# Patient Record
Sex: Male | Born: 1990
Health system: Southern US, Community
[De-identification: ages and names within clinical notes are randomized; demographics above are authoritative.]

## PROBLEM LIST (undated history)

## (undated) DIAGNOSIS — F419 Anxiety disorder, unspecified: Secondary | ICD-10-CM

## (undated) HISTORY — PX: NO PAST SURGERIES: SHX2092

## (undated) HISTORY — DX: Anxiety disorder, unspecified: F41.9

---
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
## 2010-02-15 ENCOUNTER — Emergency Department: Payer: Self-pay | Admitting: Emergency Medicine

## 2011-05-10 ENCOUNTER — Ambulatory Visit: Payer: Self-pay | Admitting: Pediatrics

## 2011-12-22 ENCOUNTER — Ambulatory Visit: Payer: Self-pay | Admitting: Pediatrics

## 2013-11-24 ENCOUNTER — Emergency Department: Payer: Self-pay | Admitting: Emergency Medicine

## 2013-11-24 LAB — URINALYSIS, COMPLETE
BILIRUBIN, UR: NEGATIVE
Bacteria: NONE SEEN
Blood: NEGATIVE
GLUCOSE, UR: NEGATIVE mg/dL (ref 0–75)
KETONE: NEGATIVE
LEUKOCYTE ESTERASE: NEGATIVE
Nitrite: NEGATIVE
Ph: 8 (ref 4.5–8.0)
Protein: NEGATIVE
RBC, UR: NONE SEEN /HPF (ref 0–5)
SPECIFIC GRAVITY: 1.011 (ref 1.003–1.030)
Squamous Epithelial: NONE SEEN
WBC UR: 1 /HPF (ref 0–5)

## 2013-11-24 LAB — CBC
HCT: 44.2 % (ref 40.0–52.0)
HGB: 14.9 g/dL (ref 13.0–18.0)
MCH: 30.6 pg (ref 26.0–34.0)
MCHC: 33.7 g/dL (ref 32.0–36.0)
MCV: 91 fL (ref 80–100)
Platelet: 202 10*3/uL (ref 150–440)
RBC: 4.86 10*6/uL (ref 4.40–5.90)
RDW: 12.6 % (ref 11.5–14.5)
WBC: 5.5 10*3/uL (ref 3.8–10.6)

## 2013-11-24 LAB — LIPASE, BLOOD: Lipase: 141 U/L (ref 73–393)

## 2013-11-24 LAB — COMPREHENSIVE METABOLIC PANEL
ALBUMIN: 4.5 g/dL (ref 3.4–5.0)
ALT: 14 U/L (ref 12–78)
Alkaline Phosphatase: 72 U/L
Anion Gap: 3 — ABNORMAL LOW (ref 7–16)
BUN: 11 mg/dL (ref 7–18)
Bilirubin,Total: 0.5 mg/dL (ref 0.2–1.0)
CALCIUM: 8.8 mg/dL (ref 8.5–10.1)
CHLORIDE: 107 mmol/L (ref 98–107)
CREATININE: 0.96 mg/dL (ref 0.60–1.30)
Co2: 29 mmol/L (ref 21–32)
EGFR (Non-African Amer.): >60
GLUCOSE: 83 mg/dL (ref 65–99)
OSMOLALITY: 276 (ref 275–301)
POTASSIUM: 4.4 mmol/L (ref 3.5–5.1)
SGOT(AST): 24 U/L (ref 15–37)
Sodium: 139 mmol/L (ref 136–145)
Total Protein: 7.4 g/dL (ref 6.4–8.2)

## 2014-10-30 DIAGNOSIS — Y998 Other external cause status: Secondary | ICD-10-CM | POA: Insufficient documentation

## 2014-10-30 DIAGNOSIS — Z720 Tobacco use: Secondary | ICD-10-CM | POA: Insufficient documentation

## 2014-10-30 DIAGNOSIS — Z23 Encounter for immunization: Secondary | ICD-10-CM | POA: Insufficient documentation

## 2014-10-30 DIAGNOSIS — Y92410 Unspecified street and highway as the place of occurrence of the external cause: Secondary | ICD-10-CM | POA: Diagnosis not present

## 2014-10-30 DIAGNOSIS — S81812A Laceration without foreign body, left lower leg, initial encounter: Secondary | ICD-10-CM | POA: Insufficient documentation

## 2014-10-30 DIAGNOSIS — Y9389 Activity, other specified: Secondary | ICD-10-CM | POA: Insufficient documentation

## 2014-10-30 DIAGNOSIS — S8992XA Unspecified injury of left lower leg, initial encounter: Secondary | ICD-10-CM | POA: Diagnosis present
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
## 2014-10-31 ENCOUNTER — Emergency Department
Admission: EM | Admit: 2014-10-31 | Discharge: 2014-10-31 | Disposition: A | Discharge status: Home / Self Care | Payer: BLUE CROSS/BLUE SHIELD | Attending: Emergency Medicine | Admitting: Emergency Medicine

## 2014-10-31 ENCOUNTER — Encounter: Payer: Self-pay | Admitting: Emergency Medicine

## 2014-10-31 ENCOUNTER — Emergency Department: Payer: BLUE CROSS/BLUE SHIELD

## 2014-10-31 DIAGNOSIS — S81812A Laceration without foreign body, left lower leg, initial encounter: Secondary | ICD-10-CM

## 2014-10-31 MED ORDER — HYDROCODONE-ACETAMINOPHEN 5-325 MG PO TABS
ORAL_TABLET | ORAL | Status: AC
  Administered 2014-10-31: 1 via ORAL
  Filled 2014-10-31: qty 1

## 2014-10-31 MED ORDER — TETANUS-DIPHTHERIA TOXOIDS TD 5-2 LFU IM INJ: 0.5000 mL | INJECTION | Freq: Once | INTRAMUSCULAR | Status: AC

## 2014-10-31 MED ORDER — TETANUS-DIPHTH-ACELL PERTUSSIS 5-2.5-18.5 LF-MCG/0.5 IM SUSP
INTRAMUSCULAR | Status: AC
  Administered 2014-10-31: 0.5 mL via INTRAMUSCULAR
  Filled 2014-10-31: qty 0.5

## 2014-10-31 MED ORDER — LIDOCAINE-EPINEPHRINE 2 %-1:100000 IJ SOLN
20.0000 mL | Freq: Once | INTRAMUSCULAR | Status: AC
  Administered 2014-10-31: 20 mL
  Filled 2014-10-31: qty 20

## 2014-10-31 MED ORDER — LIDOCAINE-EPINEPHRINE (PF) 1 %-1:200000 IJ SOLN
INTRAMUSCULAR | Status: AC
  Filled 2014-10-31: qty 30

## 2014-10-31 MED ORDER — HYDROCODONE-ACETAMINOPHEN 5-325 MG PO TABS
1.0000 | ORAL_TABLET | Freq: Once | ORAL | Status: AC
  Administered 2014-10-31: 1 via ORAL
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
## 2014-10-31 NOTE — ED Notes (Signed)
MD at bedside. 

## 2014-10-31 NOTE — Discharge Instructions (Signed)

## 2015-06-10 ENCOUNTER — Encounter: Payer: Self-pay | Admitting: Family Medicine

## 2015-06-10 ENCOUNTER — Ambulatory Visit (INDEPENDENT_AMBULATORY_CARE_PROVIDER_SITE_OTHER): Payer: BLUE CROSS/BLUE SHIELD | Admitting: Family Medicine

## 2015-06-10 VITALS — BP 112/62 | HR 68 | Temp 98.4°F | Resp 16 | Ht 73.25 in | Wt 149.0 lb

## 2015-06-10 DIAGNOSIS — F329 Major depressive disorder, single episode, unspecified: Secondary | ICD-10-CM

## 2015-06-10 DIAGNOSIS — J302 Other seasonal allergic rhinitis: Secondary | ICD-10-CM | POA: Diagnosis not present

## 2015-06-10 DIAGNOSIS — F32A Depression, unspecified: Secondary | ICD-10-CM | POA: Insufficient documentation

## 2015-06-10 MED ORDER — DIVALPROEX SODIUM 125 MG PO DR TAB: 125.0000 mg | DELAYED_RELEASE_TABLET | Freq: Two times a day (BID) | ORAL | Status: DC

## 2015-06-10 NOTE — Progress Notes (Deleted)
Patient ID: Wesley Sharp Gotsch, male   DOB: 30-Mar-1991, 24 y.o.   MRN: [id number]         Patient: Wesley Sharp Anastos Male    DOB: 30-Mar-1991   24 y.o.   MRN: [id number] Visit Date: 06/10/2015  Today's Provider: Lorie PhenixNancy Maloney, MD   Chief Complaint  Patient presents with  . Establish Care   Subjective:    Depression        This is a chronic problem.  The current episode started more than 1 year ago.   The onset quality is gradual.   The problem has been gradually worsening since onset.  Associated symptoms include decreased concentration, fatigue, insomnia, restlessness, appetite change (Not much of an appetite) and sad (Pt says this isn't too bad. ).  Associated symptoms include no helplessness, no hopelessness, not irritable, no decreased interest, no body aches, no myalgias, no headaches, no indigestion and no suicidal ideas.  Past medical history includes anxiety.   Anxiety Presents for initial visit. Onset was 1 to 5 years ago. The problem has been gradually worsening. Symptoms include decreased concentration, excessive worry, insomnia, muscle tension, nervous/anxious behavior, panic (Pt has a panic attack 1-2 times a week. ) and restlessness. Patient reports no chest pain, compulsions, confusion, dizziness, feeling of choking, irritability, obsessions, palpitations or suicidal ideas. The quality of sleep is poor.         Allergies  Allergen Reactions  . Percocet [Oxycodone-Acetaminophen] Other (See Comments)  . Sulfa Antibiotics Rash   Previous Medications   No medications on file    Review of Systems  Constitutional: Positive for activity change, appetite change (Not much of an appetite) and fatigue. Negative for irritability.  Respiratory: Negative.   Cardiovascular: Negative.  Negative for chest pain and palpitations.  Gastrointestinal: Negative.   Musculoskeletal: Negative for myalgias.  Neurological: Negative for dizziness, tremors, seizures, syncope, weakness,  light-headedness, numbness and headaches.  Psychiatric/Behavioral: Positive for depression, sleep disturbance, dysphoric mood and decreased concentration. Negative for suicidal ideas, hallucinations, behavioral problems, confusion, self-injury and agitation. The patient is nervous/anxious, has insomnia and is hyperactive.     Social History  Substance Use Topics  . Smoking status: Current Every Day Smoker -- 0.25 packs/day for 4 years    Types: Cigarettes  . Smokeless tobacco: Never Used  . Alcohol Use: Yes     Comment: Very Rarely   Objective:   BP 112/62 mmHg  Pulse 68  Temp(Src) 98.4 F (36.9 C) (Oral)  Resp 16  Ht 6' 1.25" (1.861 m)  Wt 149 lb (67.586 kg)  BMI 19.51 kg/m2  Physical Exam  Constitutional: He is not irritable.        Assessment & Plan:           Lorie PhenixNancy Maloney, MD  [facility]

## 2015-06-10 NOTE — Progress Notes (Signed)
Subjective:     Patient ID: Wesley Sharp, male   DOB: 07/26/1990, 24 y.o.   MRN: [id number]  Chief Complaint  Patient presents with  . Establish Care    Depression        This is a chronic problem.  The current episode started more than 1 year ago.   The onset quality is gradual.   The problem occurs constantly (He has good days and bad days.  ).  The problem has been gradually worsening since onset.  Associated symptoms include decreased concentration, irritable and decreased interest.  Associated symptoms include no helplessness, no hopelessness and no suicidal ideas.     The symptoms are aggravated by work stress (Has potential to get a new job.  ).  Past treatments include SSRIs - Selective serotonin reuptake inhibitors (Zoloft a few years ago.  ).  Previous treatment provided no relief (made him sick and also did not like sexual side effects.  ) relief.  Risk factors include family history of mental illness and illicit drug use.   He saw a counselor who advocates for diet, exercise, nonpharmacologic methods, which is not helping. Would like referral to psychiatrist.     No suicidal or homicidal ideation, or history or self-harm. No auditory or visual hallucination. Does have some manic symptoms.    He self-medicates with marijuana every other day. Rarely drinks alcohol. Smokers 1 pack every 2 weeks. Pt lives by himself (2 yrs).  Does well with this.   Is willing to see a psychiatrist.      Anxiety Does have panic attacks 1-2 times per week.   Review of Systems  Constitutional: Positive for activity change.  HENT: Negative.   Eyes: Negative.   Respiratory: Negative.   Cardiovascular: Negative.   Gastrointestinal: Negative.   Endocrine: Negative.   Genitourinary: Negative.   Musculoskeletal: Negative.   Skin: Negative.   Allergic/Immunologic: Negative.   Neurological: Negative.   Hematological: Negative.   Psychiatric/Behavioral: Positive for depression and decreased  concentration. Negative for suicidal ideas.   Previous Medications   No medications on file   Allergies  Allergen Reactions  . Percocet [Oxycodone-Acetaminophen] Other (See Comments)  . Sulfa Antibiotics Rash   Past Surgical History  Procedure Laterality Date  . No past surgeries     Family History  Problem Relation Age of Onset  . Healthy Mother   . Healthy Father   . Asthma Brother   . Healthy Brother   . Diabetes Maternal Uncle   . Bipolar disorder Paternal Aunt   . Mental illness Maternal Uncle    Social History   Social History  . Marital Status: Single    Spouse Name: N/A  . Number of Children: N/A  . Years of Education: H/S   Occupational History  . Full-Time     [employer]   Social History Main Topics  . Smoking status: Current Every Day Smoker -- 0.25 packs/day for 4 years    Types: Cigarettes  . Smokeless tobacco: Never Used  . Alcohol Use: Yes     Comment: Very Rarely  . Drug Use: Yes    Special: Marijuana     Comment: 1 Every Two days.  . Sexual Activity: Not on file   Other Topics Concern  . Not on file   Social History Narrative  Mom in EMS and accompanies pt to appointment. Mom is supportive, understanding and wants to help her son get better. Pt is looking for a new job,  and wants to get depression under control.      Objective:   Physical Exam  Constitutional: He is oriented to person, place, and time. He appears well-developed and well-nourished. He is irritable. No distress.  HENT:  Head: Normocephalic and atraumatic.  Eyes: Conjunctivae and EOM are normal. Pupils are equal, round, and reactive to light.  Pulmonary/Chest: No respiratory distress.  Abdominal: He exhibits no distension.  Musculoskeletal: Normal range of motion.  Neurological: He is alert and oriented to person, place, and time. No cranial nerve deficit. Coordination normal.  Skin: Skin is dry.  Psychiatric: He has a normal mood and affect. His behavior is normal.  Judgment and thought content normal.  Pleasant. Responds appropriately. Smiles at times.     BP 112/62 mmHg  Pulse 68  Temp(Src) 98.4 F (36.9 C) (Oral)  Resp 16  Ht 6' 1.25" (1.861 m)  Wt 149 lb (67.586 kg)  BMI 19.51 kg/m2      Assessment:     24 yo male with significant anxiety and depression, concerning for bipolar disorder.      Plan:     1. Seasonal allergic rhinitis Stable.   2. Depression New problem. Will treat with Depakote secondary to poor response to Zoloft, and possible bipolar. Will refer to psychiatry for further treatment.  Contracted for safety.  Will call if worsens or does not improve before follow up .   - divalproex (DEPAKOTE) 125 MG DR tablet; Take 1 tablet (125 mg total) by mouth 2 (two) times daily.  Dispense: 60 tablet; Refill: 5 - Ambulatory referral to Psychiatry     Lorie PhenixNancy Kayleann Mccaffery, MD

## 2015-07-24 ENCOUNTER — Encounter: Payer: Self-pay | Admitting: Psychiatry

## 2015-07-24 ENCOUNTER — Ambulatory Visit (INDEPENDENT_AMBULATORY_CARE_PROVIDER_SITE_OTHER): Payer: BLUE CROSS/BLUE SHIELD | Admitting: Psychiatry

## 2015-07-24 VITALS — BP 108/62 | HR 66 | Temp 98.1°F | Ht 73.25 in | Wt 144.6 lb

## 2015-07-24 DIAGNOSIS — F4001 Agoraphobia with panic disorder: Secondary | ICD-10-CM | POA: Diagnosis not present

## 2015-07-24 MED ORDER — MIRTAZAPINE 15 MG PO TABS: 15.0000 mg | ORAL_TABLET | Freq: Every day | ORAL | Status: DC

## 2015-07-24 MED ORDER — HYDROXYZINE PAMOATE 25 MG PO CAPS: 25.0000 mg | ORAL_CAPSULE | Freq: Two times a day (BID) | ORAL | Status: DC | PRN

## 2015-07-24 NOTE — Progress Notes (Signed)
Psychiatric Initial Adult Assessment   Patient Identification: Wesley Sharp MRN:  [id number] Date of Evaluation:  07/24/2015 Referral Source: [facility] Chief Complaint:   Chief Complaint    Establish Care; Anxiety; Panic Attack; Depression; Stress     Visit Diagnosis:    ICD-9-CM ICD-10-CM   1. Panic disorder with agoraphobia and mild panic attacks 300.21 F40.01    Diagnosis:   Patient Active Problem List   Diagnosis Date Noted  . Seasonal allergic rhinitis [J30.2] 06/10/2015  . Depression [F32.9] 06/10/2015   History of Present Illness:   Pt is a 25 year old male who was referred by his primary care physician at [facility] family [facility]. He presented for initial assessment. Patient reported that he has long history of anxiety and depression and was recently started on Depakote. Patient reported that he has tried Zoloft in the past but it did not help. He reported that he has started having worsening of his symptoms since he stopped using the marijuana 2 weeks ago. He reported that he has applied for a job at [employer] and they drug tested him and he cannot continue using marijuana. He reported that he feels very anxious as he continues to have withdrawal symptoms from the marijuana. He reported that he has panic attacks at times. He has also lost his appetite and is not able to eat well. He does not eat head home but when he is outside the home he is able to eat some. Patient reported that he is not sleeping well at night. Patient reported that he does not have any suicidal ideations or plans. He reported that he was restarted on Depakote by his primary care physician as they has diagnosed him with bipolar but he has not noticed any improvement in his symptoms. He appeared calm and collective during the interview. He reported that he wants to gain weight as he has never been able to gain any weight in his life. He is very close to his family who all live very close to him.  He denied any pending legal charges. He denied having any suicidal homicidal ideations or plans.  Elements:  Severity:  moderate. Associated Signs/Symptoms: Depression Symptoms:  depressed mood, fatigue, difficulty concentrating, hopelessness, anxiety, panic attacks, loss of energy/fatigue, disturbed sleep, decreased appetite, (Hypo) Manic Symptoms:  Irritable Mood, Labiality of Mood, Anxiety Symptoms:  Excessive Worry, Panic Symptoms, Psychotic Symptoms:  none PTSD Symptoms: Negative NA  Past Medical History:  Past Medical History  Diagnosis Date  . Anxiety     Past Surgical History  Procedure Laterality Date  . No past surgeries     Family History:  Family History  Problem Relation Age of Onset  . Healthy Mother   . Healthy Father   . Asthma Brother   . Healthy Brother   . Depression Brother   . Diabetes Maternal Uncle   . Bipolar disorder Paternal Aunt   . Mental illness Maternal Uncle    Social History:   Social History   Social History  . Marital Status: Single    Spouse Name: N/A  . Number of Children: N/A  . Years of Education: H/S   Occupational History  . Full-Time     [employer]   Social History Main Topics  . Smoking status: Former Smoker -- 0.25 packs/day for 4 years    Types: Cigarettes    Quit date: 07/10/2015  . Smokeless tobacco: Never Used  . Alcohol Use: No     Comment: Very  Rarely  . Drug Use: Yes    Special: Marijuana     Comment: last used 2 weeks ago  . Sexual Activity: Yes    Birth Control/ Protection: Condom   Other Topics Concern  . None   Social History Narrative   Additional Social History:  Lives by himself. Has a Girlfriend she lives in [place], known her since 5 months.  Never married, no children.  No charges.  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 Family lives closeby.     Musculoskeletal: Strength & Muscle Tone: within normal limits Gait & Station:  normal Patient leans: Right  Psychiatric Specialty Exam: HPI  ROS  Blood pressure 108/62, pulse 66, temperature 98.1 F (36.7 C), temperature source Tympanic, height 6' 1.25" (1.861 m), weight 144 lb 9.6 oz (65.59 kg), SpO2 97 %.Body mass index is 18.94 kg/(m^2).  General Appearance: Casual and Fairly Groomed  Eye Contact:  Fair  Speech:  Clear and Coherent and Normal Rate  Volume:  Normal  Mood:  Anxious  Affect:  Congruent  Thought Process:  Coherent and Goal Directed  Orientation:  Full (Time, Place, and Person)  Thought Content:  WDL  Suicidal Thoughts:  No  Homicidal Thoughts:  No  Memory:  Immediate;   Fair  Judgement:  Fair  Insight:  Fair  Psychomotor Activity:  Normal  Concentration:  Fair  Recall:  [employer] of Knowledge:Fair  Language: Fair  Akathisia:  No  Handed:  Right  AIMS (if indicated):    Assets:  Communication Skills Desire for Improvement Physical Health Social Support  ADL's:  Intact  Cognition: WNL  Sleep:  fair   Is the patient at risk to self?  No. Has the patient been a risk to self in the past 6 months?  No. Has the patient been a risk to self within the distant past?  No. Is the patient a risk to others?  No. Has the patient been a risk to others in the past 6 months?  No. Has the patient been a risk to others within the distant past?  No.  Allergies:   Allergies  Allergen Reactions  . Percocet [Oxycodone-Acetaminophen] Other (See Comments)  . Sulfa Antibiotics Rash   Current Medications: Current Outpatient Prescriptions  Medication Sig Dispense Refill  . divalproex (DEPAKOTE) 125 MG DR tablet Take 1 tablet (125 mg total) by mouth 2 (two) times daily. 60 tablet 5   No current facility-administered medications for this visit.    Previous Psychotropic Medications:  Zoloft- sick on stomach Friend has given Klonopin in past Grandmother has given him Xanax 1 time in the past . Depakote started 2 months ago at a very low dose by  the PCP   Substance Abuse History in the last 12 months:  Yes.    MJ- Stopped 2 weeks ago.  No Alcohol   Consequences of Substance Abuse: Negative NA  Medical Decision Making:  Review of Psycho-Social Stressors (1)  Treatment Plan Summary: Medication management  Discussed with patient at length about the medications treatment risks benefits and alternatives He agreed with the plan to start on mirtazapine 15 mg at bedtime to help with insomnia and weight gain. I will also give him a prescription of hydroxyzine 25 mg by mouth twice a day to help with his anxiety symptoms Continue with his medications as prescribed Advised him about the adverse effects of the medications in detail and he demonstrated understanding  Follow-up in 1 month  Brandy Hale, MD    1/26/20172:17 PM

## 2015-08-21 ENCOUNTER — Ambulatory Visit: Payer: BLUE CROSS/BLUE SHIELD | Admitting: Psychiatry

## 2015-09-04 ENCOUNTER — Other Ambulatory Visit: Payer: Self-pay | Admitting: Psychiatry

## 2015-09-04 ENCOUNTER — Ambulatory Visit (INDEPENDENT_AMBULATORY_CARE_PROVIDER_SITE_OTHER): Payer: BLUE CROSS/BLUE SHIELD | Admitting: Psychiatry

## 2015-09-04 ENCOUNTER — Encounter: Payer: Self-pay | Admitting: Psychiatry

## 2015-09-04 VITALS — BP 110/78 | HR 85 | Temp 97.7°F | Ht 73.0 in | Wt 142.6 lb

## 2015-09-04 DIAGNOSIS — F4001 Agoraphobia with panic disorder: Secondary | ICD-10-CM

## 2015-09-04 MED ORDER — MIRTAZAPINE 30 MG PO TABS: 30.0000 mg | ORAL_TABLET | Freq: Every day | ORAL | Status: DC

## 2015-09-04 MED ORDER — BUSPIRONE HCL 10 MG PO TABS: 10.0000 mg | ORAL_TABLET | Freq: Every day | ORAL | Status: DC

## 2015-09-04 NOTE — Progress Notes (Signed)
Psychiatric MD Follow up Note   Patient Identification: Wesley MilianWeston B Schoenfeld MRN:  [id number] Date of Evaluation:  09/04/2015 [facility] Chief Complaint:   Chief Complaint    Follow-up; Medication Refill; Anxiety; Fatigue     Visit Diagnosis:    ICD-9-CM ICD-10-CM   1. Panic disorder with agoraphobia and mild panic attacks 300.21 F40.01    Diagnosis:   Patient Active Problem List   Diagnosis Date Noted  . Seasonal allergic rhinitis [J30.2] 06/10/2015  . Depression [F32.9] 06/10/2015   History of Present Illness:   Pt is a 25 year old male who was [facility]. He presented for . He reports that he continues to have anxiety and has been feeling tired as he takes Vistaril twice daily. 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 His boss has also noted that he is taking longer to complete his work. Patient reported that his depressive symptoms are improving since he was started on Remeron. He is tolerating the medication well. He also sleeps well at night. His appetite is improving and he has gained some weight. He reported that he has a stopped smoking marijuana 2 weeks ago. He reported that he wants to have his medications adjusted as he is not sure if he continues to be anxious as this time. He continues to feel distractible unable to concentrate and unable to  complete his tasks in a timely fashion. He currently denied using any drugs or alcohol. He denied using any mood swings anger  or paranoia. He appeared calm and cooperative during the interview  . He denied having any suicidal homicidal ideations or plans.    Past Medical History:  Past Medical History  Diagnosis Date  . Anxiety     Past Surgical History  Procedure Laterality Date  . No past surgeries     Family History:  Family History  Problem Relation Age of Onset  . Healthy Mother   . Healthy Father   .  Asthma Brother   . Healthy Brother   . Depression Brother   . Diabetes Maternal Uncle   . Bipolar disorder Paternal Aunt   . Mental illness Maternal Uncle    Social History:   Social History   Social History  . Marital Status: Single    Spouse Name: N/A  . Number of Children: N/A  . Years of Education: H/S   Occupational History  . Full-Time     [employer]   Social History Main Topics  . Smoking status: Former Smoker -- 0.25 packs/day for 4 years    Types: Cigarettes    Start date: 09/03/2008  . Smokeless tobacco: Never Used  . Alcohol Use: No     Comment: Very Rarely  . Drug Use: Yes    Special: Marijuana     Comment: last used 2 weeks ago  . Sexual Activity: Yes    Birth Control/ Protection: Condom   Other Topics Concern  . None   Social History Narrative   Additional Social History:  Lives by himself. Has a Girlfriend she lives in [place], known her since 5 months.  Never married, no children.  No charges.  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 Family lives closeby.     Musculoskeletal: Strength & Muscle Tone: within normal limits Gait & Station: normal Patient leans: Right  Psychiatric Specialty Exam: Anxiety      ROS   Blood  pressure 110/78, pulse 85, temperature 97.7 F (36.5 C), temperature source Tympanic, height  (1.854 m), weight 142 lb 9.6 oz (64.683 kg), SpO2 96 %.Body mass index is 18.82 kg/(m^2).  General Appearance: Casual and Fairly Groomed  Eye Contact:  Fair  Speech:  Clear and Coherent and Normal Rate  Volume:  Normal  Mood:  Anxious  Affect:  Congruent  Thought Process:  Coherent and Goal Directed  Orientation:  Full (Time, Place, and Person)  Thought Content:  WDL  Suicidal Thoughts:  No  Homicidal Thoughts:  No  Memory:  Immediate;   Fair  Judgement:  Fair  Insight:  Fair  Psychomotor Activity:  Normal  Concentration:  Fair  Recall:  [employer] of Knowledge:Fair  Language:  Fair  Akathisia:  No  Handed:  Right  AIMS (if indicated):    Assets:  Communication Skills Desire for Improvement Physical Health Social Support  ADL's:  Intact  Cognition: WNL  Sleep:  fair   Is the patient at risk to self?  No. Has the patient been a risk to self in the past 6 months?  No. Has the patient been a risk to self within the distant past?  No. Is the patient a risk to others?  No. Has the patient been a risk to others in the past 6 months?  No. Has the patient been a risk to others within the distant past?  No.  Allergies:   Allergies  Allergen Reactions  . Percocet [Oxycodone-Acetaminophen] Other (See Comments)  . Sulfa Antibiotics Rash   Current Medications: Current Outpatient Prescriptions  Medication Sig Dispense Refill  . hydrOXYzine (VISTARIL) 25 MG capsule Take 1 capsule (25 mg total) by mouth 2 (two) times daily as needed. 60 capsule 1  . mirtazapine (REMERON) 15 MG tablet Take 1 tablet (15 mg total) by mouth at bedtime. 30 tablet 1   No current facility-administered medications for this visit.    Previous Psychotropic Medications:  Zoloft- sick on stomach Friend has given Klonopin in past Grandmother has given him Xanax 1 time in the past . Depakote started 2 months ago at a very low dose by the PCP   Substance Abuse History in the last 12 months:  Yes.    MJ- Stopped 2 weeks ago.  No Alcohol   Consequences of Substance Abuse: Negative NA  Medical Decision Making:  Review of Psycho-Social Stressors (1)  Treatment Plan Summary: Medication management  Discussed with patient at length about the medications treatment risks benefits and alternatives He agreed with the plan to start on mirtazapine 30 mg at bedtime to help with insomnia and weight gain. D/C Hydroxyzine I will start him on BuSpar 10 mg daily to help with his anxiety symptoms. I will also refer him to [facility] for the evaluation of ADHD Continue with his medications as  prescribed Advised him about the adverse effects of the medications in detail and he demonstrated understanding  Follow-up in 1 month or earlier depending on his symptoms   Brandy Hale, MD    3/9/20172:00 PM

## 2015-10-01 ENCOUNTER — Encounter: Payer: Self-pay | Admitting: Psychiatry

## 2015-10-01 ENCOUNTER — Ambulatory Visit (INDEPENDENT_AMBULATORY_CARE_PROVIDER_SITE_OTHER): Payer: BLUE CROSS/BLUE SHIELD | Admitting: Psychiatry

## 2015-10-01 VITALS — BP 118/74 | HR 74 | Temp 97.8°F | Ht 73.0 in | Wt 149.8 lb

## 2015-10-01 DIAGNOSIS — F988 Other specified behavioral and emotional disorders with onset usually occurring in childhood and adolescence: Secondary | ICD-10-CM

## 2015-10-01 DIAGNOSIS — F4001 Agoraphobia with panic disorder: Secondary | ICD-10-CM | POA: Diagnosis not present

## 2015-10-01 DIAGNOSIS — F909 Attention-deficit hyperactivity disorder, unspecified type: Secondary | ICD-10-CM

## 2015-10-01 MED ORDER — METHYLPHENIDATE HCL ER (OSM) 27 MG PO TBCR: 27.0000 mg | EXTENDED_RELEASE_TABLET | ORAL | Status: DC

## 2015-10-01 MED ORDER — BUSPIRONE HCL 10 MG PO TABS: 10.0000 mg | ORAL_TABLET | Freq: Every day | ORAL | Status: DC

## 2015-10-01 NOTE — Progress Notes (Signed)
Psychiatric MD Follow up Note   Patient Identification: Wesley Sharp MRN:  [id number] Date of Evaluation:  10/01/2015 Referral Source: [facility] Chief Complaint:   Chief Complaint    Follow-up; Medication Refill     Visit Diagnosis:    ICD-9-CM ICD-10-CM   1. Panic disorder with agoraphobia and mild panic attacks 300.21 F40.01   2. ADD (attention deficit disorder) 314.00 F90.9    Diagnosis:   Patient Active Problem List   Diagnosis Date Noted  . Seasonal allergic rhinitis [J30.2] 06/10/2015  . Depression [F32.9] 06/10/2015   History of Present Illness:   Pt is a 25 year old male who Presented for follow-up appointment accompanied by his mother he reported that he has stopped taking the Remeron as it was not helping him and he does not like taking the medication. He reported that he is doing well on the BuSpar and his anxiety symptoms are improving. He went for the neuropsychological Testing at [facility] and was diagnosed with ADD. He stated that he continues to have problems with concentration and focusing and managing his work. He reported that he continues to do mistakes, becoming overly nervous and struggling to complete work on a consistent basis. He reported that he has been using marijuana while working was intended to enhance his performance.  He reported that he struggles mostly in the morning and early afternoon. He has never used any stimulant medication. He is willing to start  medication at this time to help with his ADD symptoms.His mother appears very supportive and reported that he has good appetite he eats well during the daytime. He currently denied having any suicidal homicidal ideations or plans.     Past Medical History:  Past Medical History  Diagnosis Date  . Anxiety     Past Surgical History  Procedure Laterality Date  . No past surgeries     Family History:  Family History  Problem Relation Age of Onset  . Healthy Mother   . Healthy  Father   . Asthma Brother   . Healthy Brother   . Depression Brother   . Diabetes Maternal Uncle   . Bipolar disorder Paternal Aunt   . Mental illness Maternal Uncle    Social History:   Social History   Social History  . Marital Status: Single    Spouse Name: N/A  . Number of Children: N/A  . Years of Education: H/S   Occupational History  . Full-Time     [employer]   Social History Main Topics  . Smoking status: Current Every Day Smoker -- 0.25 packs/day for 4 years    Types: Cigarettes    Start date: 09/03/2008  . Smokeless tobacco: Never Used  . Alcohol Use: No     Comment: Very Rarely  . Drug Use: Yes    Special: Marijuana     Comment: last used 2 weeks ago  . Sexual Activity: Yes    Birth Control/ Protection: Condom   Other Topics Concern  . None   Social History Narrative   Additional Social History:  Lives by himself. Has a Girlfriend she lives in [place], known her since 5 months.  Never married, no children.  No charges.  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 Family lives closeby.     Musculoskeletal: Strength & Muscle Tone: within normal limits Gait & Station: normal Patient leans: Right  Psychiatric Specialty Exam: Anxiety      ROS  Blood pressure 118/74,  pulse 74, temperature 97.8 F (36.6 C), temperature source Tympanic, height 6\' 1"  (1.854 m), weight 149 lb 12.8 oz (67.949 kg), SpO2 95 %.Body mass index is 19.77 kg/(m^2).  General Appearance: Casual and Fairly Groomed  Eye Contact:  Fair  Speech:  Clear and Coherent and Normal Rate  Volume:  Normal  Mood:  Anxious  Affect:  Congruent  Thought Process:  Coherent and Goal Directed  Orientation:  Full (Time, Place, and Person)  Thought Content:  WDL  Suicidal Thoughts:  No  Homicidal Thoughts:  No  Memory:  Immediate;   Fair  Judgement:  Fair  Insight:  Fair  Psychomotor Activity:  Normal  Concentration:  Fair  Recall:  [employer]  [employer] of  Knowledge:Fair  Language: Fair  Akathisia:  No  Handed:  Right  AIMS (if indicated):    Assets:  Communication Skills Desire for Improvement Physical Health Social Support  ADL's:  Intact  Cognition: WNL  Sleep:  fair   Is the patient at risk to self?  No. Has the patient been a risk to self in the past 6 months?  No. Has the patient been a risk to self within the distant past?  No. Is the patient a risk to others?  No. Has the patient been a risk to others in the past 6 months?  No. Has the patient been a risk to others within the distant past?  No.  Allergies:   Allergies  Allergen Reactions  . Percocet [Oxycodone-Acetaminophen] Other (See Comments)  . Sulfa Antibiotics Rash   Current Medications: Current Outpatient Prescriptions  Medication Sig Dispense Refill  . busPIRone (BUSPAR) 10 MG tablet Take 1 tablet (10 mg total) by mouth daily at 6 (six) AM. 30 tablet 1   No current facility-administered medications for this visit.    Previous Psychotropic Medications:  Zoloft- sick on stomach Friend has given Klonopin in past Grandmother has given him Xanax 1 time in the past . Depakote started 2 months ago at a very low dose by the PCP   Substance Abuse History in the last 12 months:  Yes.    MJ- Stopped 2 weeks ago.  No Alcohol   Consequences of Substance Abuse: Negative NA  Medical Decision Making:  Review of Psycho-Social Stressors (1)  Treatment Plan Summary: Medication management  Discussed with patient at length about the medications treatment risks benefits and alternatives He agreed with the plan to start on mirtazapine 30 mg at bedtime to help with insomnia and weight gain. Continue BuSpar 10 mg daily to help with his anxiety symptoms. I will start him on Concerta 27 mg daily for his ADD symptoms. Discussed with him at length about the stability medications adverse risks benefits and alternatives and she did most recent understanding.   Follow-up in  1 month or earlier depending on his symptoms    More than 50% of the time spent in psychoeducation, counseling and coordination of care.    This note was generated in part or whole with voice recognition software. Voice regonition is usually quite accurate but there are transcription errors that can and very often do occur. I apologize for any typographical errors that were not detected and corrected.    Brandy HaleUzma Hindy Perrault, MD    4/5/20171:17 PM

## 2015-10-22 ENCOUNTER — Other Ambulatory Visit: Payer: Self-pay

## 2015-10-23 ENCOUNTER — Other Ambulatory Visit: Payer: Self-pay

## 2015-10-23 NOTE — Telephone Encounter (Signed)
received a fax requesting a 90 day supply fo mirtazapine 30mg    pt was last seen on  10-01-15 next appt  10-29-15

## 2015-10-25 IMAGING — CT CT STONE STUDY
1 of 4 series · 5 of 46 positions shown, 10 images · non-contrast
Comparison: None.

CLINICAL DATA: Left flank pain

EXAM:
CT ABDOMEN AND PELVIS WITHOUT CONTRAST
TECHNIQUE: Multidetector CT imaging of the abdomen and pelvis was performed
following the standard protocol without IV contrast.

[Series 4: lung windows · axial · 0.69mm/px · z∈[-742,-666]mm · 5 of 23 slices shown, 10 images]
[im 4/23  soft-tissue]
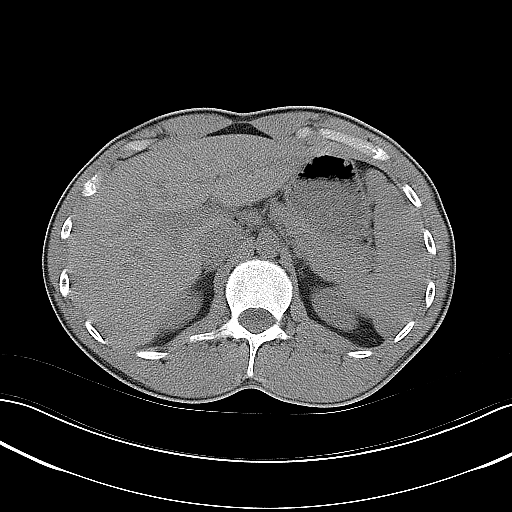
[im 4/23  bone]
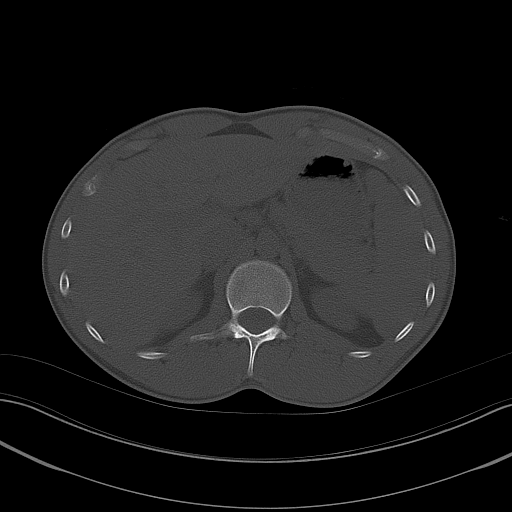
[im 8/23  soft-tissue]
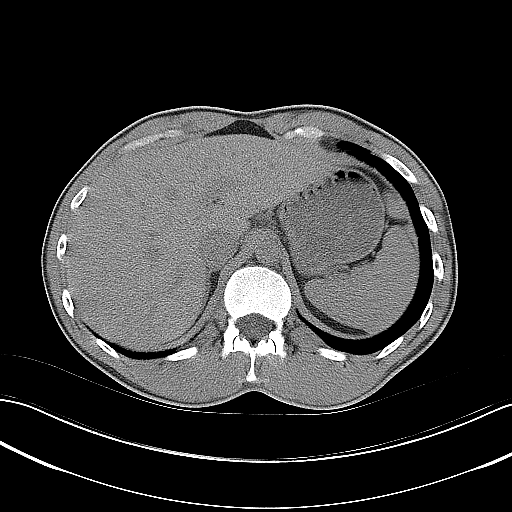
[im 8/23  lung]
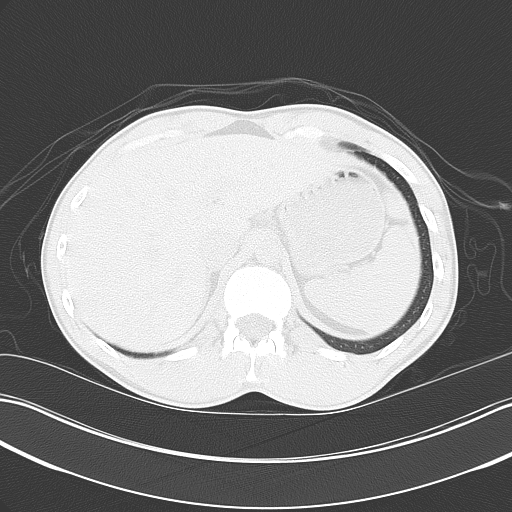
[im 12/23  soft-tissue]
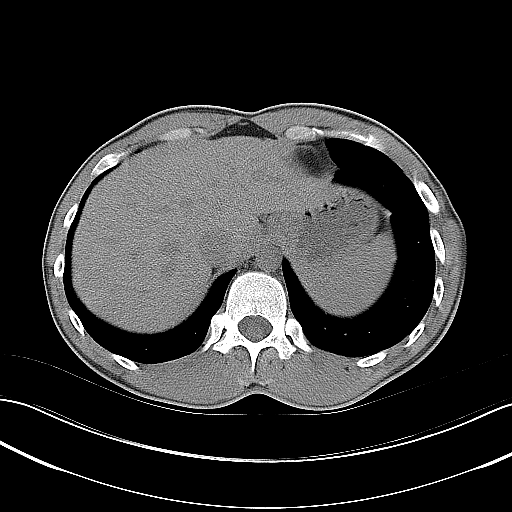
[im 12/23  lung]
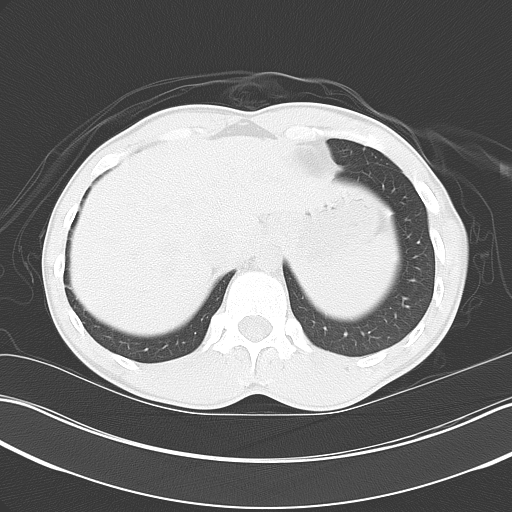
[im 15/23  soft-tissue]
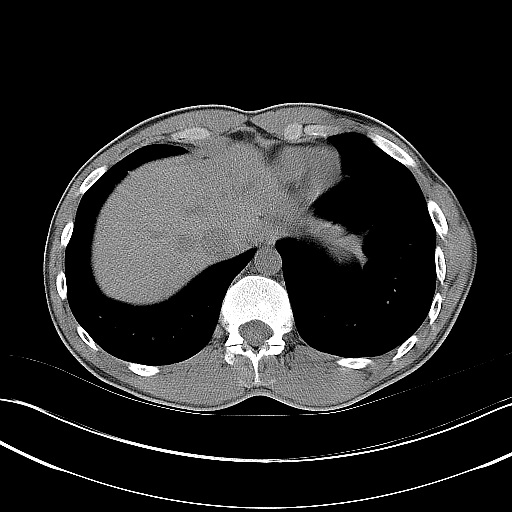
[im 15/23  lung]
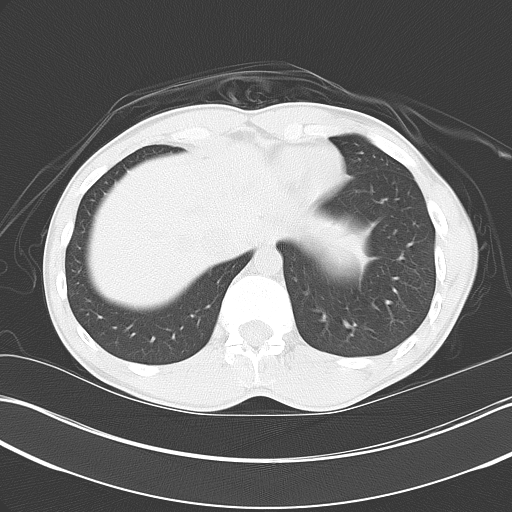
[im 19/23  soft-tissue]
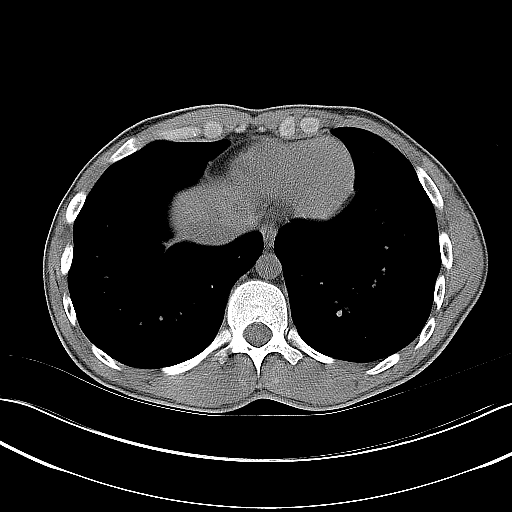
[im 19/23  lung]
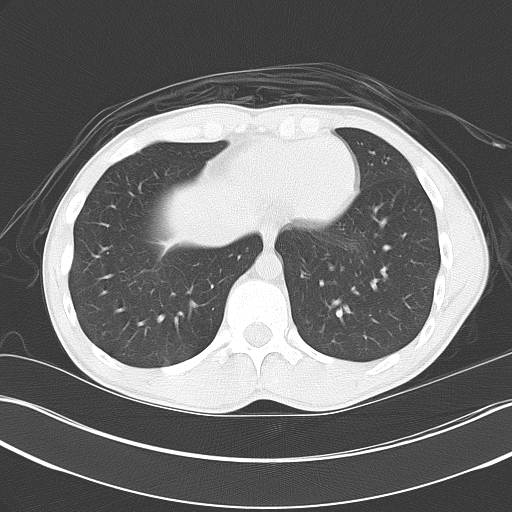

[5 of 46 positions shown; findings below may reference images not displayed]

FINDINGS: The lung bases are clear.

No renal, ureteral, or bladder calculi. No obstructive uropathy. No
perinephric stranding is seen. The kidneys are symmetric in size
without evidence for exophytic mass. The bladder is unremarkable.

The liver demonstrates no focal abnormality. The gallbladder is
unremarkable. The spleen demonstrates no focal abnormality. The
adrenal glands and pancreas are normal.

The unopacified stomach, duodenum, small intestine and large
intestine are unremarkable, but evaluation is limited by lack of
oral contrast. There is no pneumoperitoneum, pneumatosis, or portal
venous gas. There is no abdominal or pelvic free fluid. There is no
lymphadenopathy.

The abdominal aorta is normal in caliber.

The osseous structures are unremarkable.
IMPRESSION: 1. No urolithiasis or obstructive uropathy.

## 2015-10-28 ENCOUNTER — Other Ambulatory Visit: Payer: Self-pay | Admitting: Psychiatry

## 2015-10-29 ENCOUNTER — Ambulatory Visit (INDEPENDENT_AMBULATORY_CARE_PROVIDER_SITE_OTHER): Payer: BLUE CROSS/BLUE SHIELD | Admitting: Psychiatry

## 2015-10-29 ENCOUNTER — Encounter: Payer: Self-pay | Admitting: Psychiatry

## 2015-10-29 VITALS — BP 118/78 | HR 98 | Temp 97.9°F | Ht 73.0 in | Wt 144.2 lb

## 2015-10-29 DIAGNOSIS — F4001 Agoraphobia with panic disorder: Secondary | ICD-10-CM | POA: Diagnosis not present

## 2015-10-29 DIAGNOSIS — F909 Attention-deficit hyperactivity disorder, unspecified type: Secondary | ICD-10-CM | POA: Diagnosis not present

## 2015-10-29 DIAGNOSIS — F988 Other specified behavioral and emotional disorders with onset usually occurring in childhood and adolescence: Secondary | ICD-10-CM

## 2015-10-29 MED ORDER — MIRTAZAPINE 30 MG PO TABS: 30.0000 mg | ORAL_TABLET | Freq: Every day | ORAL | Status: DC

## 2015-10-29 MED ORDER — METHYLPHENIDATE HCL ER (OSM) 27 MG PO TBCR: 27.0000 mg | EXTENDED_RELEASE_TABLET | ORAL | Status: DC

## 2015-10-29 MED ORDER — BUSPIRONE HCL 10 MG PO TABS: 10.0000 mg | ORAL_TABLET | Freq: Every day | ORAL | Status: DC

## 2015-10-29 NOTE — Progress Notes (Signed)
Psychiatric MD Follow up Note   Patient Identification: Wesley Sharp MRN:  [id number] Date of Evaluation:  10/29/2015 [facility] Chief Complaint:   Chief Complaint    Follow-up; Medication Refill     Visit Diagnosis:    ICD-9-CM ICD-10-CM   1. Panic disorder with agoraphobia and mild panic attacks 300.21 F40.01   2. ADD (attention deficit disorder) 314.00 F90.9    Diagnosis:   Patient Active Problem List   Diagnosis Date Noted  . Seasonal allergic rhinitis [J30.2] 06/10/2015  . Depression [F32.9] 06/10/2015   History of Present Illness:   Pt is a 25 year old male who presented for follow-up appointment. He reported that He has started taking the medications. He reported that he is currently doing well on his medications and feels that the Concerta is helping him. His concentration is improving. He reported that he is not having any side effects of the medication. He eats well and is trying to gain weight. He looked up on the side effects of the medication. He is not experiencing any side effects at this time. Patient reported that he started throwing up early yesterday morning and was shaking and might lose his job. He was asking me if he can write a note for him. Patient reported that his girlfriend is very supportive.  His mother helps him with his medication management. 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 His focus and attention and concentration is improving at this time.      Past Medical History:  Past Medical History  Diagnosis Date  . Anxiety     Past Surgical History  Procedure Laterality Date  . No past surgeries     Family History:  Family History  Problem Relation Age of Onset  . Healthy Mother   . Healthy Father   . Asthma Brother   . Healthy Brother   . Depression Brother   . Diabetes Maternal Uncle   . Bipolar disorder Paternal Aunt   . Mental illness Maternal Uncle    Social History:    Social History   Social History  . Marital Status: Single    Spouse Name: N/A  . Number of Children: N/A  . Years of Education: H/S   Occupational History  . Full-Time     [employer]   Social History Main Topics  . Smoking status: Current Every Day Smoker -- 0.25 packs/day for 4 years    Types: Cigarettes    Start date: 09/03/2008  . Smokeless tobacco: Never Used  . Alcohol Use: No     Comment: Very Rarely  . Drug Use: Yes    Special: Marijuana     Comment: last used 2 weeks ago  . Sexual Activity: Yes    Birth Control/ Protection: Condom   Other Topics Concern  . None   Social History Narrative   Additional Social History:  Lives by himself. Has a Girlfriend she lives in [place], known her since 5 months.  Never married, no children.  No charges.  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 Family lives closeby.     Musculoskeletal: Strength & Muscle Tone: within normal limits Gait & Station: normal Patient leans: Right  Psychiatric Specialty Exam: Anxiety      ROS  Blood pressure 118/78, pulse 98, temperature 97.9 F (36.6 C), temperature source Tympanic, height 6\' 1"  (1.854 m), weight 144 lb 3.2 oz (65.409 kg), SpO2 96 %.Body mass  index is 19.03 kg/(m^2).  General Appearance: Casual and Fairly Groomed  Eye Contact:  Fair  Speech:  Clear and Coherent and Normal Rate  Volume:  Normal  Mood:  Anxious  Affect:  Congruent  Thought Process:  Coherent and Goal Directed  Orientation:  Full (Time, Place, and Person)  Thought Content:  WDL  Suicidal Thoughts:  No  Homicidal Thoughts:  No  Memory:  Immediate;   Fair  Judgement:  Fair  Insight:  Fair  Psychomotor Activity:  Normal  Concentration:  Fair  Recall:  [employer] of Knowledge:Fair  Language: Fair  Akathisia:  No  Handed:  Right  AIMS (if indicated):    Assets:  Communication Skills Desire for Improvement Physical Health Social Support  ADL's:  Intact   Cognition: WNL  Sleep:  fair   Is the patient at risk to self?  No. Has the patient been a risk to self in the past 6 months?  No. Has the patient been a risk to self within the distant past?  No. Is the patient a risk to others?  No. Has the patient been a risk to others in the past 6 months?  No. Has the patient been a risk to others within the distant past?  No.  Allergies:   Allergies  Allergen Reactions  . Percocet [Oxycodone-Acetaminophen] Other (See Comments)  . Sulfa Antibiotics Rash   Current Medications: Current Outpatient Prescriptions  Medication Sig Dispense Refill  . busPIRone (BUSPAR) 10 MG tablet Take 1 tablet (10 mg total) by mouth daily at 6 (six) AM. 30 tablet 1  . methylphenidate (CONCERTA) 27 MG PO CR tablet Take 1 tablet (27 mg total) by mouth every morning. 30 tablet 0  . mirtazapine (REMERON) 30 MG tablet Take 30 mg by mouth at bedtime.  1   No current facility-administered medications for this visit.    Previous Psychotropic Medications:  Zoloft- sick on stomach Friend has given Klonopin in past Grandmother has given him Xanax 1 time in the past . Depakote started 2 months ago at a very low dose by the PCP   Substance Abuse History in the last 12 months:  Yes.    MJ- Stopped 2 weeks ago.  No Alcohol   Consequences of Substance Abuse: Negative NA  Medical Decision Making:  Review of Psycho-Social Stressors (1)  Treatment Plan Summary: Medication management  Discussed with patient at length about the medications treatment risks benefits and alternatives Continue Remeron 30 mg at bedtime. Continue BuSpar 10 mg daily to help with his anxiety symptoms. Continue Concerta 27 mg daily for his ADD symptoms. Discussed with him at length about the stability medications adverse risks benefits and alternatives and she did most recent understanding.   Follow-up in 1 month or earlier depending on his symptoms    More than 50% of the time spent in  psychoeducation, counseling and coordination of care.    This note was generated in part or whole with voice recognition software. Voice regonition is usually quite accurate but there are transcription errors that can and very often do occur. I apologize for any typographical errors that were not detected and corrected.    Brandy Hale, MD    5/3/20171:37 PM

## 2015-11-27 ENCOUNTER — Ambulatory Visit (INDEPENDENT_AMBULATORY_CARE_PROVIDER_SITE_OTHER): Payer: BLUE CROSS/BLUE SHIELD | Admitting: Psychiatry

## 2015-11-27 ENCOUNTER — Encounter: Payer: Self-pay | Admitting: Psychiatry

## 2015-11-27 VITALS — BP 110/62 | HR 88 | Temp 97.7°F | Ht 73.0 in | Wt 142.6 lb

## 2015-11-27 DIAGNOSIS — F988 Other specified behavioral and emotional disorders with onset usually occurring in childhood and adolescence: Secondary | ICD-10-CM

## 2015-11-27 DIAGNOSIS — F909 Attention-deficit hyperactivity disorder, unspecified type: Secondary | ICD-10-CM

## 2015-11-27 DIAGNOSIS — F4001 Agoraphobia with panic disorder: Secondary | ICD-10-CM | POA: Diagnosis not present

## 2015-11-27 MED ORDER — METHYLPHENIDATE HCL ER (OSM) 27 MG PO TBCR: 27.0000 mg | EXTENDED_RELEASE_TABLET | ORAL | Status: DC

## 2015-11-27 MED ORDER — MIRTAZAPINE 30 MG PO TABS: 30.0000 mg | ORAL_TABLET | Freq: Every day | ORAL | Status: DC

## 2015-11-27 NOTE — Progress Notes (Signed)
Psychiatric MD Follow up Note   Patient Identification: Wesley Sharp MRN:  [id number] Date of Evaluation:  11/27/2015 [facility] Chief Complaint:   Chief Complaint    Follow-up; Medication Refill     Visit Diagnosis:    ICD-9-CM ICD-10-CM   1. Panic disorder with agoraphobia and mild panic attacks 300.21 F40.01    Diagnosis:   Patient Active Problem List   Diagnosis Date Noted  . Seasonal allergic rhinitis [J30.2] 06/10/2015  . Depression [F32.9] 06/10/2015   History of Present Illness:   Pt is a 25 year old male who presented for follow-up appointment. He reported that he has started taking the medications. He reported that he is currently doing well on his medications and feels that the Concerta is helping him. He has been working long hours. He reported that the BuSpar was causing him to sweat more so he stopped taking the medication. He appeared very happy during the interview. He reported that his  focus and attention and concentration is improving at this time.  Patient currently denied having any suicidal ideations or plans.     Past Medical History:  Past Medical History  Diagnosis Date  . Anxiety     Past Surgical History  Procedure Laterality Date  . No past surgeries     Family History:  Family History  Problem Relation Age of Onset  . Healthy Mother   . Healthy Father   . Asthma Brother   . Healthy Brother   . Depression Brother   . Diabetes Maternal Uncle   . Bipolar disorder Paternal Aunt   . Mental illness Maternal Uncle    Social History:   Social History   Social History  . Marital Status: Single    Spouse Name: N/A  . Number of Children: N/A  . Years of Education: H/S   Occupational History  . Full-Time     [employer]   Social History Main Topics  . Smoking status: Current Every Day Smoker -- 0.25 packs/day for 4 years    Types: Cigarettes    Start date: 09/03/2008  . Smokeless tobacco: Never Used   . Alcohol Use: No     Comment: Very Rarely  . Drug Use: Yes    Special: Marijuana     Comment: last used 2 weeks ago  . Sexual Activity: Yes    Birth Control/ Protection: Condom   Other Topics Concern  . Not on file   Social History Narrative   Additional Social History:  Lives by himself. Has a Girlfriend she lives in [place], known her since 5 months.  Never married, no children.  No charges.  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 Family lives closeby.     Musculoskeletal: Strength & Muscle Tone: within normal limits Gait & Station: normal Patient leans: Right  Psychiatric Specialty Exam: Anxiety      ROS  Blood pressure 110/62, pulse 88, temperature 97.7 F (36.5 C), temperature source Tympanic, height 6\' 1"  (1.854 m), weight 142 lb 9.6 oz (64.683 kg), SpO2 97 %.Body mass index is 18.82 kg/(m^2).  General Appearance: Casual and Fairly Groomed  Eye Contact:  Fair  Speech:  Clear and Coherent and Normal Rate  Volume:  Normal  Mood:  Anxious  Affect:  Congruent  Thought Process:  Coherent and Goal Directed  Orientation:  Full (Time, Place, and Person)  Thought Content:  WDL  Suicidal Thoughts:  No  Homicidal Thoughts:  No  Memory:  Immediate;   Fair  Judgement:  Fair  Insight:  Fair  Psychomotor Activity:  Normal  Concentration:  Fair  Recall:  [employer] of Knowledge:Fair  Language: Fair  Akathisia:  No  Handed:  Right  AIMS (if indicated):    Assets:  Communication Skills Desire for Improvement Physical Health Social Support  ADL's:  Intact  Cognition: WNL  Sleep:  fair   Is the patient at risk to self?  No. Has the patient been a risk to self in the past 6 months?  No. Has the patient been a risk to self within the distant past?  No. Is the patient a risk to others?  No. Has the patient been a risk to others in the past 6 months?  No. Has the patient been a risk to others within the distant past?   No.  Allergies:   Allergies  Allergen Reactions  . Percocet [Oxycodone-Acetaminophen] Other (See Comments)  . Sulfa Antibiotics Rash   Current Medications: Current Outpatient Prescriptions  Medication Sig Dispense Refill  . busPIRone (BUSPAR) 10 MG tablet Take 1 tablet (10 mg total) by mouth daily at 6 (six) AM. 30 tablet 1  . methylphenidate (CONCERTA) 27 MG PO CR tablet Take 1 tablet (27 mg total) by mouth every morning. 30 tablet 0  . mirtazapine (REMERON) 30 MG tablet Take 1 tablet (30 mg total) by mouth at bedtime. 30 tablet 1   No current facility-administered medications for this visit.    Previous Psychotropic Medications:  Zoloft- sick on stomach Friend has given Klonopin in past Grandmother has given him Xanax 1 time in the past . Depakote started 2 months ago at a very low dose by the PCP   Substance Abuse History in the last 12 months:  Yes.    MJ- Stopped 2 weeks ago.  No Alcohol   Consequences of Substance Abuse: Negative NA  Medical Decision Making:  Review of Psycho-Social Stressors (1)  Treatment Plan Summary: Medication management  Discussed with patient at length about the medications treatment risks benefits and alternatives Continue Remeron 30 mg at bedtime. D/c Buspar.  Continue Concerta 27 mg daily for his ADD symptoms. Discussed with him at length about the stability medications adverse risks benefits and alternatives and she did most recent understanding.   Follow-up in 1 month or earlier depending on his symptoms    More than 50% of the time spent in psychoeducation, counseling and coordination of care.    This note was generated in part or whole with voice recognition software. Voice regonition is usually quite accurate but there are transcription errors that can and very often do occur. I apologize for any typographical errors that were not detected and corrected.    Brandy Hale, MD    6/1/201712:16 PM

## 2015-12-25 ENCOUNTER — Ambulatory Visit (INDEPENDENT_AMBULATORY_CARE_PROVIDER_SITE_OTHER): Payer: Self-pay | Admitting: Psychiatry

## 2016-01-21 ENCOUNTER — Encounter: Payer: Self-pay | Admitting: Psychiatry

## 2016-01-21 ENCOUNTER — Ambulatory Visit (INDEPENDENT_AMBULATORY_CARE_PROVIDER_SITE_OTHER): Payer: BLUE CROSS/BLUE SHIELD | Admitting: Psychiatry

## 2016-01-21 VITALS — BP 116/78 | HR 86 | Temp 98.4°F | Ht 73.0 in | Wt 141.6 lb

## 2016-01-21 DIAGNOSIS — F909 Attention-deficit hyperactivity disorder, unspecified type: Secondary | ICD-10-CM

## 2016-01-21 DIAGNOSIS — F4001 Agoraphobia with panic disorder: Secondary | ICD-10-CM

## 2016-01-21 DIAGNOSIS — F988 Other specified behavioral and emotional disorders with onset usually occurring in childhood and adolescence: Secondary | ICD-10-CM

## 2016-01-21 MED ORDER — MIRTAZAPINE 30 MG PO TABS: 30.0000 mg | ORAL_TABLET | Freq: Every day | ORAL | 1 refills | Status: DC

## 2016-01-21 MED ORDER — METHYLPHENIDATE HCL ER (OSM) 27 MG PO TBCR: 27.0000 mg | EXTENDED_RELEASE_TABLET | ORAL | 0 refills | Status: DC

## 2016-01-21 MED ORDER — OLANZAPINE 2.5 MG PO TABS: 2.5000 mg | ORAL_TABLET | Freq: Every day | ORAL | 0 refills | Status: DC

## 2016-01-21 NOTE — Progress Notes (Signed)
Psychiatric MD Follow up Note   Patient Identification: Wesley Sharp MRN:  [id number] Date of Evaluation:  01/21/2016 Referral Source: [facility] Chief Complaint:    Visit Diagnosis:    ICD-9-CM ICD-10-CM   1. Panic disorder with agoraphobia and mild panic attacks 300.21 F40.01   2. ADD (attention deficit disorder) 314.00 F90.9    Diagnosis:   Patient Active Problem List   Diagnosis Date Noted  . Seasonal allergic rhinitis [J30.2] 06/10/2015  . Depression [F32.9] 06/10/2015   History of Present Illness:   Pt is a 25 year old male who presented for follow-up appointment. He reported that he Has been working long hours and this is the first time in 20 days that he has a day off. He appeared very skinny and thin during this interview. He reported that he has been eating well but consistently has been losing weight. He has been compliant with medication. He reported that the medications are helping him. Patient reported that he is having problems with sleep at this time. We discussed about the medications. I will start him on Zyprexa to help with his appetite and with insomnia. He is also on Remeron at bedtime. He appeared very skinny during the interview. He denied having any side effects of the medications at this time.    Patient currently denied having any suicidal ideations or plans.     Past Medical History:  Past Medical History:  Diagnosis Date  . Anxiety     Past Surgical History:  Procedure Laterality Date  . NO PAST SURGERIES     Family History:  Family History  Problem Relation Age of Onset  . Healthy Mother   . Healthy Father   . Asthma Brother   . Healthy Brother   . Depression Brother   . Diabetes Maternal Uncle   . Bipolar disorder Paternal Aunt   . Mental illness Maternal Uncle    Social History:   Social History   Social History  . Marital status: Single    Spouse name: N/A  . Number of children: N/A  . Years of education: H/S    Occupational History  . Full-Time     [employer]   Social History Main Topics  . Smoking status: Current Every Day Smoker    Packs/day: 0.25    Years: 4.00    Types: Cigarettes    Start date: 09/03/2008  . Smokeless tobacco: Never Used  . Alcohol use No     Comment: Very Rarely  . Drug use:     Types: Marijuana     Comment: last used 2 weeks ago  . Sexual activity: Yes    Birth control/ protection: Condom   Other Topics Concern  . Not on file   Social History Narrative  . No narrative on file   Additional Social History:  Lives by himself. Has a Girlfriend she lives in [place], known her since 5 months.  Never married, no children.  No charges.  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 Family lives closeby.     Musculoskeletal: Strength & Muscle Tone: within normal limits Gait & Station: normal Patient leans: Right  Psychiatric Specialty Exam: Anxiety  Symptoms include insomnia and nervous/anxious behavior.      Review of Systems  Psychiatric/Behavioral: The patient is nervous/anxious and has insomnia.     There were no vitals taken for this visit.There is no height or weight on file to calculate BMI.  General Appearance: Casual and  Fairly Groomed  Eye Contact:  Fair  Speech:  Clear and Coherent and Normal Rate  Volume:  Normal  Mood:  Anxious  Affect:  Congruent  Thought Process:  Coherent and Goal Directed  Orientation:  Full (Time, Place, and Person)  Thought Content:  WDL  Suicidal Thoughts:  No  Homicidal Thoughts:  No  Memory:  Immediate;   Fair  Judgement:  Fair  Insight:  Fair  Psychomotor Activity:  Normal  Concentration:  Fair  Recall:  [employer] of Knowledge:Fair  Language: Fair  Akathisia:  No  Handed:  Right  AIMS (if indicated):    Assets:  Communication Skills Desire for Improvement Physical Health Social Support  ADL's:  Intact  Cognition: WNL  Sleep:  fair   Is the patient at risk to  self?  No. Has the patient been a risk to self in the past 6 months?  No. Has the patient been a risk to self within the distant past?  No. Is the patient a risk to others?  No. Has the patient been a risk to others in the past 6 months?  No. Has the patient been a risk to others within the distant past?  No.  Allergies:   Allergies  Allergen Reactions  . Percocet [Oxycodone-Acetaminophen] Other (See Comments)  . Sulfa Antibiotics Rash   Current Medications: Current Outpatient Prescriptions  Medication Sig Dispense Refill  . methylphenidate (CONCERTA) 27 MG PO CR tablet Take 1 tablet (27 mg total) by mouth every morning. To be filled 02/21/16 30 tablet 0  . mirtazapine (REMERON) 30 MG tablet Take 1 tablet (30 mg total) by mouth at bedtime. 30 tablet 1  . OLANZapine (ZYPREXA) 2.5 MG tablet Take 1 tablet (2.5 mg total) by mouth at bedtime. 30 tablet 0   No current facility-administered medications for this visit.     Previous Psychotropic Medications:  Zoloft- sick on stomach Friend has given Klonopin in past Grandmother has given him Xanax 1 time in the past . Depakote started 2 months ago at a very low dose by the PCP   Substance Abuse History in the last 12 months:  Yes.    MJ- Stopped 2 weeks ago.  No Alcohol   Consequences of Substance Abuse: Negative NA  Medical Decision Making:  Review of Psycho-Social Stressors (1)  Treatment Plan Summary: Medication management  Discussed with patient at length about the medications treatment risks benefits and alternatives Continue Remeron 30 mg at bedtime. I will start him on Zyprexa 2. 5 mg at bedtime to help with insomnia Continue Concerta 27 mg daily for his ADD symptoms. Discussed with him at length about the stability medications adverse risks benefits and alternatives and she did most recent understanding.   Follow-up in 1 month or earlier depending on his symptoms    More than 50% of the time spent in psychoeducation,  counseling and coordination of care.    This note was generated in part or whole with voice recognition software. Voice regonition is usually quite accurate but there are transcription errors that can and very often do occur. I apologize for any typographical errors that were not detected and corrected.    Brandy Hale, MD    7/26/20174:37 PM

## 2016-02-18 ENCOUNTER — Ambulatory Visit (INDEPENDENT_AMBULATORY_CARE_PROVIDER_SITE_OTHER): Payer: BLUE CROSS/BLUE SHIELD | Admitting: Psychiatry

## 2016-02-18 VITALS — BP 111/66 | HR 60 | Ht 72.25 in | Wt 143.0 lb

## 2016-02-18 DIAGNOSIS — F909 Attention-deficit hyperactivity disorder, unspecified type: Secondary | ICD-10-CM | POA: Diagnosis not present

## 2016-02-18 DIAGNOSIS — F4001 Agoraphobia with panic disorder: Secondary | ICD-10-CM

## 2016-02-18 DIAGNOSIS — F988 Other specified behavioral and emotional disorders with onset usually occurring in childhood and adolescence: Secondary | ICD-10-CM

## 2016-02-18 MED ORDER — OLANZAPINE 5 MG PO TABS: 5.0000 mg | ORAL_TABLET | Freq: Every day | ORAL | 0 refills | Status: DC

## 2016-02-18 MED ORDER — MIRTAZAPINE 30 MG PO TABS: 30.0000 mg | ORAL_TABLET | Freq: Every day | ORAL | 1 refills | Status: DC

## 2016-02-18 NOTE — Progress Notes (Signed)
Psychiatric MD Follow up Note   Patient Identification: Oswaldo MilianWeston B Halberg MRN:  [id number] Date of Evaluation:  02/18/2016 [facility] Chief Complaint:   Chief Complaint    Follow-up     Visit Diagnosis:    ICD-9-CM ICD-10-CM   1. Panic disorder with agoraphobia and mild panic attacks 300.21 F40.01   2. ADD (attention deficit disorder) 314.00 F90.9    Diagnosis:   Patient Active Problem List   Diagnosis Date Noted  . Seasonal allergic rhinitis [J30.2] 06/10/2015  . Depression [F32.9] 06/10/2015   History of Present Illness:   Pt is a 25 year old male who presented for follow-up appointment. He reported that he has Started responding to the Zyprexa which was started at last appointment. He reported that he has noticed improvement in his symptoms. Patient reported that he has been compliant with his medications. He is able to sleep well with the help of the medications. He reported that the Zyprexa is helping him and he is able to sleep well. He just had relationship issues with his girlfriend and she is not talking to him any longer. Patient reported that he is feeling depressed because of the same. He currently denied having any anxiety or paranoia. He reported that he is currently living with his brother. He is able to cope 4. He denied having any suicidal homicidal ideations or plans. He appeared calm and collected during the interview. He is willing to have the dose of Zyprexa increased at this time.      Past Medical History:  Past Medical History:  Diagnosis Date  . Anxiety     Past Surgical History:  Procedure Laterality Date  . NO PAST SURGERIES     Family History:  Family History  Problem Relation Age of Onset  . Healthy Mother   . Healthy Father   . Asthma Brother   . Healthy Brother   . Depression Brother   . Diabetes Maternal Uncle   . Bipolar disorder Paternal Aunt   . Mental illness Maternal Uncle    Social History:   Social  History   Social History  . Marital status: Single    Spouse name: N/A  . Number of children: N/A  . Years of education: H/S   Occupational History  . Full-Time     [employer]   Social History Main Topics  . Smoking status: Current Every Day Smoker    Packs/day: 0.25    Years: 4.00    Types: Cigarettes    Start date: 09/03/2008  . Smokeless tobacco: Never Used  . Alcohol use No     Comment: Very Rarely  . Drug use:     Types: Marijuana     Comment: last used 2 weeks ago  . Sexual activity: Yes    Birth control/ protection: Condom   Other Topics Concern  . Not on file   Social History Narrative  . No narrative on file   Additional Social History:  Lives by himself. Has a Girlfriend she lives in [place], known her since 5 months.  Never married, no children.  No charges.  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 Family lives closeby.     Musculoskeletal: Strength & Muscle Tone: within normal limits Gait & Station: normal Patient leans: Right  Psychiatric Specialty Exam: Anxiety  Symptoms include insomnia and nervous/anxious behavior.      Review of Systems  Psychiatric/Behavioral: The patient is nervous/anxious and has insomnia.  Blood pressure 111/66, pulse 60, height 6' 0.25" (1.835 m), weight 143 lb (64.9 kg).Body mass index is 19.26 kg/m.  General Appearance: Casual and Fairly Groomed  Eye Contact:  Fair  Speech:  Clear and Coherent and Normal Rate  Volume:  Normal  Mood:  Anxious  Affect:  Congruent  Thought Process:  Coherent and Goal Directed  Orientation:  Full (Time, Place, and Person)  Thought Content:  WDL  Suicidal Thoughts:  No  Homicidal Thoughts:  No  Memory:  Immediate;   Fair  Judgement:  Fair  Insight:  Fair  Psychomotor Activity:  Normal  Concentration:  Fair  Recall:  [employer]  Fund of Knowledge:Fair  Language: Fair  Akathisia:  No  Handed:  Right  AIMS (if indicated):    Assets:   Communication Skills Desire for Improvement Physical Health Social Support  ADL's:  Intact  Cognition: WNL  Sleep:  fair   Is the patient at risk to self?  No. Has the patient been a risk to self in the past 6 months?  No. Has the patient been a risk to self within the distant past?  No. Is the patient a risk to others?  No. Has the patient been a risk to others in the past 6 months?  No. Has the patient been a risk to others within the distant past?  No.  Allergies:   Allergies  Allergen Reactions  . Percocet [Oxycodone-Acetaminophen] Other (See Comments)  . Sulfa Antibiotics Rash   Current Medications: Current Outpatient Prescriptions  Medication Sig Dispense Refill  . methylphenidate (CONCERTA) 27 MG PO CR tablet Take 1 tablet (27 mg total) by mouth every morning. To be filled 02/21/16 30 tablet 0  . mirtazapine (REMERON) 30 MG tablet Take 1 tablet (30 mg total) by mouth at bedtime. 30 tablet 1  . OLANZapine (ZYPREXA) 5 MG tablet Take 1 tablet (5 mg total) by mouth at bedtime. 30 tablet 0   No current facility-administered medications for this visit.     Previous Psychotropic Medications:  Zoloft- sick on stomach Friend has given Klonopin in past Grandmother has given him Xanax 1 time in the past . Depakote started 2 months ago at a very low dose by the PCP   Substance Abuse History in the last 12 months:  Yes.    MJ- Stopped 2 weeks ago.  No Alcohol   Consequences of Substance Abuse: Negative NA  Medical Decision Making:  Review of Psycho-Social Stressors (1)  Treatment Plan Summary: Medication management  Discussed with patient at length about the medications treatment risks benefits and alternatives Continue Remeron 30 mg at bedtime. I will start him on Zyprexa 5  mg at bedtime to help with insomnia Continue Concerta 27 mg daily for his ADD symptoms. He has prescription of the medication   Discussed with him at length about the medications adverse risks  benefits and alternatives and she did most recent understanding.   Follow-up in 1 month or earlier depending on his symptoms    More than 50% of the time spent in psychoeducation, counseling and coordination of care.    This note was generated in part or whole with voice recognition software. Voice regonition is usually quite accurate but there are transcription errors that can and very often do occur. I apologize for any typographical errors that were not detected and corrected.    Brandy HaleUzma Ormond Lazo, MD    8/23/20173:02 PM

## 2016-02-29 ENCOUNTER — Other Ambulatory Visit: Payer: Self-pay | Admitting: Psychiatry

## 2016-03-17 ENCOUNTER — Ambulatory Visit: Payer: BLUE CROSS/BLUE SHIELD | Admitting: Psychiatry

## 2016-03-24 ENCOUNTER — Other Ambulatory Visit: Payer: Self-pay | Admitting: Psychiatry

## 2016-04-07 ENCOUNTER — Ambulatory Visit (INDEPENDENT_AMBULATORY_CARE_PROVIDER_SITE_OTHER): Payer: BLUE CROSS/BLUE SHIELD | Admitting: Psychiatry

## 2016-04-07 VITALS — BP 104/66 | HR 67 | Ht 73.0 in | Wt 148.6 lb

## 2016-04-07 DIAGNOSIS — F900 Attention-deficit hyperactivity disorder, predominantly inattentive type: Secondary | ICD-10-CM | POA: Diagnosis not present

## 2016-04-07 DIAGNOSIS — F4001 Agoraphobia with panic disorder: Secondary | ICD-10-CM

## 2016-04-07 MED ORDER — METHYLPHENIDATE HCL ER (OSM) 27 MG PO TBCR: 27.0000 mg | EXTENDED_RELEASE_TABLET | ORAL | 0 refills | Status: DC

## 2016-04-07 MED ORDER — OLANZAPINE 5 MG PO TABS: 5.0000 mg | ORAL_TABLET | Freq: Every day | ORAL | 1 refills | Status: DC

## 2016-04-07 MED ORDER — MIRTAZAPINE 30 MG PO TABS: 30.0000 mg | ORAL_TABLET | Freq: Every day | ORAL | 1 refills | Status: DC

## 2016-04-07 NOTE — Progress Notes (Signed)
Psychiatric MD Follow up Note   Patient Identification: Oswaldo MilianWeston B Collard MRN:  [id number] Date of Evaluation:  04/07/2016 Referral [facility] Chief Complaint:    Visit Diagnosis:    ICD-9-CM ICD-10-CM   1. Panic disorder with agoraphobia and mild panic attacks 300.21 F40.01   2. Attention deficit hyperactivity disorder (ADHD), predominantly inattentive type 314.00 F90.0    Diagnosis:   Patient Active Problem List   Diagnosis Date Noted  . Seasonal allergic rhinitis [J30.2] 06/10/2015  . Depression [F32.9] 06/10/2015   History of Present Illness:   Pt is a 25 year old male who presented for follow-up appointment. He reported that he has Compliant with his medications. He reported that he has been doing well and has been taking his medications as prescribed. He works long hours. He appeared calm and alert during the interview. He has started gaining some weight. No acute issues noted at this time. He reported that he does not have any  problems with sleep.  He currently denied having any anxiety or paranoia. He reported that he is currently living with his brother.  He denied having any suicidal homicidal ideations or plans. He appeared calm and collected during the interview.    Past Medical History:  Past Medical History:  Diagnosis Date  . Anxiety     Past Surgical History:  Procedure Laterality Date  . NO PAST SURGERIES     Family History:  Family History  Problem Relation Age of Onset  . Healthy Mother   . Healthy Father   . Asthma Brother   . Healthy Brother   . Depression Brother   . Diabetes Maternal Uncle   . Bipolar disorder Paternal Aunt   . Mental illness Maternal Uncle    Social History:   Social History   Social History  . Marital status: Single    Spouse name: N/A  . Number of children: N/A  . Years of education: H/S   Occupational History  . Full-Time     [employer]   Social History Main Topics  . Smoking status:  Current Every Day Smoker    Packs/day: 0.25    Years: 4.00    Types: Cigarettes    Start date: 09/03/2008  . Smokeless tobacco: Never Used  . Alcohol use No     Comment: Very Rarely  . Drug use:     Types: Marijuana     Comment: last used 2 weeks ago  . Sexual activity: Yes    Birth control/ protection: Condom   Other Topics Concern  . Not on file   Social History Narrative  . No narrative on file   Additional Social History:  Lives by himself. Has a Girlfriend she lives in [place], known her since 5 months.  Never married, no children.  No charges.  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 Family lives closeby.     Musculoskeletal: Strength & Muscle Tone: within normal limits Gait & Station: normal Patient leans: Right  Psychiatric Specialty Exam: Anxiety  Symptoms include insomnia.      Review of Systems  Psychiatric/Behavioral: The patient has insomnia.     Blood pressure 104/66, pulse 67, height 6\' 1"  (1.854 m), weight 148 lb 9.6 oz (67.4 kg).Body mass index is 19.61 kg/m.  General Appearance: Casual and Fairly Groomed  Eye Contact:  Fair  Speech:  Clear and Coherent and Normal Rate  Volume:  Normal  Mood:  Anxious  Affect:  Congruent  Thought  Process:  Coherent and Goal Directed  Orientation:  Full (Time, Place, and Person)  Thought Content:  WDL  Suicidal Thoughts:  No  Homicidal Thoughts:  No  Memory:  Immediate;   Fair  Judgement:  Fair  Insight:  Fair  Psychomotor Activity:  Normal  Concentration:  Fair  Recall:  [employer] of Knowledge:Fair  Language: Fair  Akathisia:  No  Handed:  Right  AIMS (if indicated):    Assets:  Communication Skills Desire for Improvement Physical Health Social Support  ADL's:  Intact  Cognition: WNL  Sleep:  fair   Is the patient at risk to self?  No. Has the patient been a risk to self in the past 6 months?  No. Has the patient been a risk to self within the distant past?   No. Is the patient a risk to others?  No. Has the patient been a risk to others in the past 6 months?  No. Has the patient been a risk to others within the distant past?  No.  Allergies:   Allergies  Allergen Reactions  . Percocet [Oxycodone-Acetaminophen] Other (See Comments)  . Sulfa Antibiotics Rash   Current Medications: Current Outpatient Prescriptions  Medication Sig Dispense Refill  . methylphenidate (CONCERTA) 27 MG PO CR tablet Take 1 tablet (27 mg total) by mouth every morning. 30 tablet 0  . methylphenidate (CONCERTA) 27 MG PO CR tablet Take 1 tablet (27 mg total) by mouth every morning. To be filled 02/21/16 30 tablet 0  . mirtazapine (REMERON) 30 MG tablet Take 1 tablet (30 mg total) by mouth at bedtime. 90 tablet 1  . OLANZapine (ZYPREXA) 5 MG tablet Take 1 tablet (5 mg total) by mouth at bedtime. 90 tablet 1   No current facility-administered medications for this visit.     Previous Psychotropic Medications:  Zoloft- sick on stomach Friend has given Klonopin in past Grandmother has given him Xanax 1 time in the past . Depakote started 2 months ago at a very low dose by the PCP   Substance Abuse History in the last 12 months:  Yes.    MJ- Stopped 2 weeks ago.  No Alcohol   Consequences of Substance Abuse: Negative NA  Medical Decision Making:  Review of Psycho-Social Stressors (1)  Treatment Plan Summary: Medication management  Discussed with patient at length about the medications treatment risks benefits and alternatives Continue Remeron 30 mg at bedtime.-Refilled for 90 days Continue  Zyprexa 5  mg at bedtime to help with insomnia-refilled for 90 days Continue Concerta 27 mg daily for his ADD symptoms.  He was given 2 month supply of the medication of Concerta.  Discussed with him at length about the medications adverse risks benefits and alternatives and she did most recent understanding.   Follow-up in 2 month or earlier depending on his  symptoms    More than 50% of the time spent in psychoeducation, counseling and coordination of care.    This note was generated in part or whole with voice recognition software. Voice regonition is usually quite accurate but there are transcription errors that can and very often do occur. I apologize for any typographical errors that were not detected and corrected.    Brandy Hale, MD    10/11/20173:21 PM

## 2016-04-08 ENCOUNTER — Other Ambulatory Visit: Payer: Self-pay | Admitting: Psychiatry

## 2016-04-08 ENCOUNTER — Telehealth: Payer: Self-pay

## 2016-04-08 MED ORDER — METHYLPHENIDATE HCL ER (OSM) 27 MG PO TBCR: 27.0000 mg | EXTENDED_RELEASE_TABLET | ORAL | 0 refills | Status: DC
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
## 2016-04-08 NOTE — Telephone Encounter (Signed)
Pt lost all his prescriptions of Concerta. Will refill one month supply at this time.

## 2016-04-08 NOTE — Telephone Encounter (Signed)
called let her know that rx was ready for concerta  id # [id number] order # [id number]

## 2016-05-05 ENCOUNTER — Ambulatory Visit: Payer: BLUE CROSS/BLUE SHIELD | Admitting: Psychiatry

## 2016-05-24 ENCOUNTER — Encounter: Payer: Self-pay | Admitting: Physician Assistant

## 2016-05-24 ENCOUNTER — Ambulatory Visit (INDEPENDENT_AMBULATORY_CARE_PROVIDER_SITE_OTHER): Payer: BLUE CROSS/BLUE SHIELD | Admitting: Physician Assistant

## 2016-05-24 VITALS — BP 100/60 | HR 73 | Temp 98.0°F | Resp 16 | Wt 151.2 lb

## 2016-05-24 DIAGNOSIS — J301 Allergic rhinitis due to pollen: Secondary | ICD-10-CM

## 2016-05-24 DIAGNOSIS — H6981 Other specified disorders of Eustachian tube, right ear: Secondary | ICD-10-CM

## 2016-05-24 MED ORDER — CETIRIZINE-PSEUDOEPHEDRINE ER 5-120 MG PO TB12: 1.0000 | ORAL_TABLET | Freq: Two times a day (BID) | ORAL | 0 refills | Status: DC

## 2016-05-24 MED ORDER — FLUTICASONE PROPIONATE 50 MCG/ACT NA SUSP
2.0000 | Freq: Every day | NASAL | 6 refills | Status: DC
Start: 2016-05-24 — End: 2017-02-25

## 2016-05-24 NOTE — Patient Instructions (Signed)
Eustachian Tube Dysfunction Introduction The eustachian tube connects the middle ear to the back of the nose. It regulates air pressure in the middle ear by allowing air to move between the ear and nose. It also helps to drain fluid from the middle ear space. When the eustachian tube does not function properly, air pressure, fluid, or both can build up in the middle ear. Eustachian tube dysfunction can affect one or both ears. What are the causes? This condition happens when the eustachian tube becomes blocked or cannot open normally. This may result from:  Ear infections.  Colds and other upper respiratory infections.  Allergies.  Irritation, such as from cigarette smoke or acid from the stomach coming up into the esophagus (gastroesophageal reflux).  Sudden changes in air pressure, such as from descending in an airplane.  Abnormal growths in the nose or throat, such as nasal polyps, tumors, or enlarged tissue at the back of the throat (adenoids). What increases the risk? This condition may be more likely to develop in people who smoke and people who are overweight. Eustachian tube dysfunction may also be more likely to develop in children, especially children who have:  Certain birth defects of the mouth, such as cleft palate.  Large tonsils and adenoids. What are the signs or symptoms? Symptoms of this condition may include:  A feeling of fullness in the ear.  Ear pain.  Clicking or popping noises in the ear.  Ringing in the ear.  Hearing loss.  Loss of balance. Symptoms may get worse when the air pressure around you changes, such as when you travel to an area of high elevation or fly on an airplane. How is this diagnosed? This condition may be diagnosed based on:  Your symptoms.  A physical exam of your ear, nose, and throat.  Tests, such as those that measure:  The movement of your eardrum (tympanogram).  Your hearing (audiometry). How is this  treated? Treatment depends on the cause and severity of your condition. If your symptoms are mild, you may be able to relieve your symptoms by moving air into ("popping") your ears. If you have symptoms of fluid in your ears, treatment may include:  Decongestants.  Antihistamines.  Nasal sprays or ear drops that contain medicines that reduce swelling (steroids). In some cases, you may need to have a procedure to drain the fluid in your eardrum (myringotomy). In this procedure, a small tube is placed in the eardrum to:  Drain the fluid.  Restore the air in the middle ear space. Follow these instructions at home:  Take over-the-counter and prescription medicines only as told by your health care provider.  Use techniques to help pop your ears as recommended by your health care provider. These may include:  Chewing gum.  Yawning.  Frequent, forceful swallowing.  Closing your mouth, holding your nose closed, and gently blowing as if you are trying to blow air out of your nose.  Do not do any of the following until your health care provider approves:  Travel to high altitudes.  Fly in airplanes.  Work in a pressurized cabin or room.  Scuba dive.  Keep your ears dry. Dry your ears completely after showering or bathing.  Do not smoke.  Keep all follow-up visits as told by your health care provider. This is important. Contact a health care provider if:  Your symptoms do not go away after treatment.  Your symptoms come back after treatment.  You are unable to pop your ears.    You have:  A fever.  Pain in your ear.  Pain in your head or neck.  Fluid draining from your ear.  Your hearing suddenly changes.  You become very dizzy.  You lose your balance. This information is not intended to replace advice given to you by your health care provider. Make sure you discuss any questions you have with your health care provider. Document Released: 07/11/2015 Document  Revised: 11/20/2015 Document Reviewed: 07/03/2014  2017 Elsevier  

## 2016-05-24 NOTE — Progress Notes (Signed)
Patient: Wesley MilianWeston B Stallings Male    DOB: 08-04-1990   25 y.o.   MRN: [id number] Visit Date: 05/24/2016  Today's Provider: Margaretann LovelessJennifer M Vernell Townley, PA-C   Chief Complaint  Patient presents with  . Ear Problem   Subjective:    HPI Patient is here today with ear problem (possible wax build up). He reports that he is having trouble hearing especially from the right ear. He has been seen previously for cerumen impaction and states this does feel slightly similar. Patient is here with his grandmother.      Allergies  Allergen Reactions  . Percocet [Oxycodone-Acetaminophen] Other (See Comments)  . Sulfa Antibiotics Rash     Current Outpatient Prescriptions:  .  methylphenidate (CONCERTA) 27 MG PO CR tablet, Take 1 tablet (27 mg total) by mouth every morning., Disp: 30 tablet, Rfl: 0 .  methylphenidate (CONCERTA) 27 MG PO CR tablet, Take 1 tablet (27 mg total) by mouth every morning., Disp: 30 tablet, Rfl: 0 .  mirtazapine (REMERON) 30 MG tablet, Take 1 tablet (30 mg total) by mouth at bedtime., Disp: 90 tablet, Rfl: 1 .  OLANZapine (ZYPREXA) 5 MG tablet, Take 1 tablet (5 mg total) by mouth at bedtime., Disp: 90 tablet, Rfl: 1  Review of Systems  Constitutional: Negative.   HENT: Positive for ear pain, hearing loss (muffled) and sinus pressure. Negative for congestion, ear discharge, postnasal drip, rhinorrhea, sinus pain, sneezing, sore throat, tinnitus and trouble swallowing.   Respiratory: Negative.   Cardiovascular: Negative.   Gastrointestinal: Negative for abdominal pain.  Neurological: Negative for dizziness and headaches.    Social History  Substance Use Topics  . Smoking status: Current Every Day Smoker    Packs/day: 0.25    Years: 4.00    Types: Cigarettes    Start date: 09/03/2008  . Smokeless tobacco: Never Used  . Alcohol use No     Comment: Very Rarely   Objective:   BP 100/60 (BP Location: Right Arm, Patient Position: Sitting, Cuff Size: Normal)   Pulse 73    Temp 98 F (36.7 C) (Oral)   Resp 16   Wt 151 lb 3.2 oz (68.6 kg)   BMI 19.95 kg/m   Physical Exam  Constitutional: He appears well-developed and well-nourished. No distress.  HENT:  Head: Normocephalic and atraumatic.  Right Ear: Hearing, external ear and ear canal normal. Tympanic membrane is bulging. Tympanic membrane is not erythematous. A middle ear effusion is present.  Left Ear: Hearing, external ear and ear canal normal. Tympanic membrane is not erythematous and not bulging.  No middle ear effusion.  Nose: Mucosal edema and rhinorrhea present. Right sinus exhibits no maxillary sinus tenderness and no frontal sinus tenderness. Left sinus exhibits no maxillary sinus tenderness and no frontal sinus tenderness.  Mouth/Throat: Uvula is midline, oropharynx is clear and moist and mucous membranes are normal. No oropharyngeal exudate, posterior oropharyngeal edema or posterior oropharyngeal erythema.  Eyes: Conjunctivae and EOM are normal. Pupils are equal, round, and reactive to light. Right eye exhibits no discharge. Left eye exhibits no discharge.  Neck: Normal range of motion. Neck supple. No tracheal deviation present. No Brudzinski's sign and no Kernig's sign noted. No thyromegaly present.  Cardiovascular: Normal rate, regular rhythm and normal heart sounds.  Exam reveals no gallop and no friction rub.   No murmur heard. Pulmonary/Chest: Effort normal and breath sounds normal. No stridor. No respiratory distress. He has no wheezes. He has no rales.  Lymphadenopathy:  He has no cervical adenopathy.  Skin: Skin is warm and dry. He is not diaphoretic.  Vitals reviewed.      Assessment & Plan:     1. ETD (Eustachian tube dysfunction), right There was no cerumen impaction on exam but he did have serous fluid buildup behind his tympanic membrane with the right having a slight bulge. I will give him Zyrtec-D prescription as below. I also sent a prescription for Flonase for him to use.  I did instruct him to use the Flonase by turning his nose to his toes and then directing the tip of the spray bottle toward the ears. He voiced understanding. He is to call the office if there is no improvement. - cetirizine-pseudoephedrine (ZYRTEC-D) 5-120 MG tablet; Take 1 tablet by mouth 2 (two) times daily.  Dispense: 60 tablet; Refill: 0 - fluticasone (FLONASE) 50 MCG/ACT nasal spray; Place 2 sprays into both nostrils daily.  Dispense: 16 g; Refill: 6  2. Acute seasonal allergic rhinitis due to pollen See above medical treatment plan. - cetirizine-pseudoephedrine (ZYRTEC-D) 5-120 MG tablet; Take 1 tablet by mouth 2 (two) times daily.  Dispense: 60 tablet; Refill: 0 - fluticasone (FLONASE) 50 MCG/ACT nasal spray; Place 2 sprays into both nostrils daily.  Dispense: 16 g; Refill: 6       Margaretann LovelessJennifer M Eliyanna Ault, PA-C  [facility]

## 2016-09-06 ENCOUNTER — Ambulatory Visit: Payer: BLUE CROSS/BLUE SHIELD | Admitting: Podiatry

## 2016-09-10 ENCOUNTER — Telehealth: Payer: Self-pay | Admitting: *Deleted

## 2016-09-10 ENCOUNTER — Ambulatory Visit (INDEPENDENT_AMBULATORY_CARE_PROVIDER_SITE_OTHER): Payer: BLUE CROSS/BLUE SHIELD | Admitting: Podiatry

## 2016-09-10 ENCOUNTER — Ambulatory Visit (INDEPENDENT_AMBULATORY_CARE_PROVIDER_SITE_OTHER): Payer: BLUE CROSS/BLUE SHIELD

## 2016-09-10 ENCOUNTER — Encounter: Payer: Self-pay | Admitting: Podiatry

## 2016-09-10 DIAGNOSIS — M7751 Other enthesopathy of right foot: Secondary | ICD-10-CM | POA: Diagnosis not present

## 2016-09-10 DIAGNOSIS — M79671 Pain in right foot: Secondary | ICD-10-CM | POA: Diagnosis not present

## 2016-09-10 DIAGNOSIS — M722 Plantar fascial fibromatosis: Secondary | ICD-10-CM | POA: Diagnosis not present

## 2016-09-10 NOTE — Patient Instructions (Signed)
Pre-Operative Instructions  Congratulations, you have decided to take an important step to improving your quality of life.  You can be assured that the doctors of [facility] will be with you every step of the way.  Plan to be at the surgery center/hospital at least 1 (one) hour prior to your scheduled time unless otherwise directed by the surgical center/hospital staff.  You must have a responsible adult accompany you, remain during the surgery and drive you home.  Make sure you have directions to the surgical center/hospital and know how to get there on time. For hospital based surgery you will need to obtain a history and physical form from your family physician within 1 month prior to the date of surgery- we will give you a form for you primary physician.  We make every effort to accommodate the date you request for surgery.  There are however, times where surgery dates or times have to be moved.  We will contact you as soon as possible if a change in schedule is required.   No Aspirin/Ibuprofen for one week before surgery.  If you are on aspirin, any non-steroidal anti-inflammatory medications (Mobic, Aleve, Ibuprofen) you should stop taking it 7 days prior to your surgery.  You make take Tylenol  For pain prior to surgery.  Medications- If you are taking daily heart and blood pressure medications, seizure, reflux, allergy, asthma, anxiety, pain or diabetes medications, make sure the surgery center/hospital is aware before the day of surgery so they may notify you which medications to take or avoid the day of surgery. No food or drink after midnight the night before surgery unless directed otherwise by surgical center/hospital staff. No alcoholic beverages 24 hours prior to surgery.  No smoking 24 hours prior to or 24 hours after surgery. Wear loose pants or shorts- loose enough to fit over bandages, boots, and casts. No slip on shoes, sneakers are best. Bring your boot with you to the  surgery center/hospital.  Also bring crutches or a walker if your physician has prescribed it for you.  If you do not have this equipment, it will be provided for you after surgery. If you have not been contracted by the surgery center/hospital by the day before your surgery, call to confirm the date and time of your surgery. Leave-time from work may vary depending on the type of surgery you have.  Appropriate arrangements should be made prior to surgery with your employer. Prescriptions will be provided immediately following surgery by your doctor.  Have these filled as soon as possible after surgery and take the medication as directed. Remove nail polish on the operative foot. Wash the night before surgery.  The night before surgery wash the foot and leg well with the antibacterial soap provided and water paying special attention to beneath the toenails and in between the toes.  Rinse thoroughly with water and dry well with a towel.  Perform this wash unless told not to do so by your physician.  Enclosed: 1 Ice pack (please put in freezer the night before surgery)   1 Hibiclens skin cleaner   Pre-op Instructions  If you have any questions regarding the instructions, do not hesitate to call our office.  [place]: [address] [place], [place] [address] [phone]  [place]: [address] [place], [place] [address] [phone]  [place]: [address]  [place], [place] [address] [phone]   Dr. Norman Regal DPM, Dr. Matthew Wagoner DPM, Dr. M. Todd Hyatt DPM, Dr. Titorya Stover   DPM 

## 2016-09-10 NOTE — Telephone Encounter (Signed)
"  This is [name]'s mother.  I'm returning your call.  You said you needed to reschedule his surgery."  Yes, Dr. Logan BoresEvans does not have anything available on March 22.  He can do it on April 5.  "Okay we'll take it.  What time does he need to be there?"  Someone from the surgical center will call with the arrival time a day or two prior to surgery date.

## 2016-09-10 NOTE — Telephone Encounter (Signed)
I left patient a message to give me a call back to reschedule his surgery.  March 22 is not available.

## 2016-09-10 NOTE — Progress Notes (Signed)
   Subjective:    Patient ID: Wesley Sharp, male    DOB: 07-Jun-1991, [age] y.o.   MRN: [id number]  HPI    Review of Systems  Musculoskeletal:       Feet hurt       Objective:   Physical Exam        Assessment & Plan:

## 2016-09-13 ENCOUNTER — Telehealth: Payer: Self-pay | Admitting: *Deleted

## 2016-09-13 NOTE — Telephone Encounter (Signed)
I am calling to verify that we scheduled Wesley Sharp's surgery for 09/30/2016.  "Yes, that is correct."

## 2016-09-20 NOTE — Progress Notes (Signed)
Patient ID: Wesley Sharp, male   DOB: [birth year], 26 y.o.   MRN: [id number]    Subjective:  26 year old male presents the office today as a new patient for evaluation of bilateral foot pain with the right foot more symptomatic than the left. Patient states that his foot hurts with certain shoes especially on the top of his feet. Patient has has tried all conservative modalities to relieve symptoms including shoe gear modifications. Patient has had this pain for several months without any alleviation of symptoms. Patient presents today for further treatment and evaluation    Objective/Physical Exam General: The patient is alert and oriented x3 in no acute distress.  Dermatology: Skin is warm, dry and supple bilateral lower extremities. Negative for open lesions or macerations.  Vascular: Palpable pedal pulses bilaterally. No edema or erythema noted. Capillary refill within normal limits.  Neurological: Epicritic and protective threshold grossly intact bilaterally.   Musculoskeletal Exam: Large palpable spur noted to the first metatarsal cuneiform of the bilateral feet more prominent on the right foot dorsal aspect. Range of motion within normal limits to all pedal and ankle joints bilateral. Muscle strength 5/5 in all groups bilateral.   Radiographic Exam:  Osseous spurring noted to the first metatarsocuneiform right foot. No fracture or degeneration noted within the joints.  Assessment: #1 first metatarsal cuneiform joint exostosis right foot.   Plan of Care:  #1 Patient was evaluated. X-rays reviewed #2 today we discussed additional conservative versus surgical management of the exostosis to the first metatarsal and the cuneiform of the right foot. #3 today we are going to proceed with surgical intervention which will include exostectomy of the first metatarsal and exostectomy of the medial cuneiform right foot. All possible complications and details of surgery were explained. All patient  questions were answered. No guarantees were expressed or implied. #4 authorization for surgery initiated today #5 postoperative shoe dispensed #6 the patient cannot tolerate Percocet. Prescription for hydrocodone will be prescribed on the day of surgery. #7 return to clinic 1 week postop   Felecia ShellingBrent M. Catelynn Sparger, DPM [facility]  Dr. Felecia ShellingBrent M. Flay Ghosh, DPM    [address]                                        [place], [place] [address]                Office [phone]  Fax [phone]

## 2016-09-30 ENCOUNTER — Encounter: Payer: Self-pay | Admitting: Podiatry

## 2016-09-30 DIAGNOSIS — M86471 Chronic osteomyelitis with draining sinus, right ankle and foot: Secondary | ICD-10-CM | POA: Diagnosis not present

## 2016-09-30 DIAGNOSIS — G5761 Lesion of plantar nerve, right lower limb: Secondary | ICD-10-CM | POA: Diagnosis not present

## 2016-09-30 DIAGNOSIS — M25774 Osteophyte, right foot: Secondary | ICD-10-CM | POA: Diagnosis not present
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
## 2016-10-08 ENCOUNTER — Ambulatory Visit (INDEPENDENT_AMBULATORY_CARE_PROVIDER_SITE_OTHER): Payer: BLUE CROSS/BLUE SHIELD | Admitting: Podiatry

## 2016-10-08 ENCOUNTER — Encounter: Payer: BLUE CROSS/BLUE SHIELD | Admitting: Podiatry

## 2016-10-08 ENCOUNTER — Encounter: Payer: Self-pay | Admitting: Podiatry

## 2016-10-08 DIAGNOSIS — Z9889 Other specified postprocedural states: Secondary | ICD-10-CM

## 2016-10-09 NOTE — Progress Notes (Signed)
Subjective: Patient is a 26 year old male presenting today for POV #1 s/p exostectomy of the right foot DOS 09/30/16. He states he is doing well although he hit his right great toe on his ottoman. He reports some bruising that has now resolved.  Objective: Skin incisions are well coapted with staples intact. No sign of infectious process noted. Minimal edema noted to the right lower extremity.  Assessment: Status post exostectomy-right. DOS 09/30/16  Plan of care: Today dressings were changed. Patient was evaluated. Return to clinic in 1 week for staple removal.  Felecia Shelling, DPM [facility]  Dr. Felecia Shelling, DPM    [address]                                        [place], [place] [address]                Office [phone]  Fax [phone]

## 2016-10-15 ENCOUNTER — Ambulatory Visit (INDEPENDENT_AMBULATORY_CARE_PROVIDER_SITE_OTHER): Payer: BLUE CROSS/BLUE SHIELD | Admitting: Podiatry

## 2016-10-15 DIAGNOSIS — M7751 Other enthesopathy of right foot: Secondary | ICD-10-CM

## 2016-10-15 DIAGNOSIS — Z9889 Other specified postprocedural states: Secondary | ICD-10-CM

## 2016-10-15 MED ORDER — TRAMADOL HCL 50 MG PO TABS: 50.0000 mg | ORAL_TABLET | Freq: Three times a day (TID) | ORAL | 0 refills | Status: DC | PRN

## 2016-10-17 NOTE — Progress Notes (Signed)
Subjective: Patient is a 26 year old male presenting today for POV #2 s/p exostectomy of the right foot DOS 09/30/16. He states he is doing well but states he can feel the staples when he walks.   Objective: Skin incisions are well coapted with staples intact. No sign of infectious process noted. Minimal edema noted to the right lower extremity.  Assessment: Status post exostectomy-right. DOS 09/30/16  Plan of care: Patient evaluated. Prescription for Tramadol 50 mg #60 given. Pt can begin showering in two days. Sutures removed and dressing changed. Return to clinic in 2 weeks for postop X-Plasse.  Felecia Shelling, DPM [facility]  Dr. Felecia Shelling, DPM    [address]                                        [place], [place] [address]                Office [phone]  Fax [phone]

## 2016-11-02 ENCOUNTER — Ambulatory Visit (INDEPENDENT_AMBULATORY_CARE_PROVIDER_SITE_OTHER): Payer: Self-pay | Admitting: Podiatry

## 2016-11-02 ENCOUNTER — Ambulatory Visit (INDEPENDENT_AMBULATORY_CARE_PROVIDER_SITE_OTHER): Payer: BLUE CROSS/BLUE SHIELD

## 2016-11-02 ENCOUNTER — Encounter: Payer: Self-pay | Admitting: Podiatry

## 2016-11-02 DIAGNOSIS — M7751 Other enthesopathy of right foot: Secondary | ICD-10-CM

## 2016-11-02 DIAGNOSIS — Z9889 Other specified postprocedural states: Secondary | ICD-10-CM

## 2016-11-04 NOTE — Progress Notes (Signed)
Subjective: Patient is a 26 year old male presenting today for POV #3 s/p exostectomy of the right foot DOS 09/30/16. He states he is doing better but is experiencing pain when wearing shoes.   Objective: Skin incisions are well coapted with staples intact. No sign of infectious process noted. Minimal edema noted to the right lower extremity.  Radiographic Exam: Normal osseous mineralization. No fractures or dislocations noted. No dorsal spurring noted.  Assessment: Status post exostectomy right- healed DOS 09/30/16  Plan of care: Patient evaluated. Pt may return to full activity without any restrictions. Return to clinic as needed.  Felecia ShellingBrent M. Evans, DPM [facility]  Dr. Felecia ShellingBrent M. Evans, DPM    [address]                                        [place], [place] [address]                Office [phone]  Fax [phone]

## 2016-11-11 NOTE — Progress Notes (Signed)
DOS 09/30/2016 Exostectomy first metatarsal right: Exostectomy medial cuneiform right. Any other indicated procedure.

## 2017-02-25 ENCOUNTER — Encounter: Payer: Self-pay | Admitting: Physician Assistant

## 2017-02-25 ENCOUNTER — Ambulatory Visit (INDEPENDENT_AMBULATORY_CARE_PROVIDER_SITE_OTHER): Payer: BLUE CROSS/BLUE SHIELD | Admitting: Physician Assistant

## 2017-02-25 VITALS — BP 110/70 | HR 60 | Temp 98.1°F | Resp 16 | Wt 140.2 lb

## 2017-02-25 DIAGNOSIS — F900 Attention-deficit hyperactivity disorder, predominantly inattentive type: Secondary | ICD-10-CM | POA: Diagnosis not present

## 2017-02-25 DIAGNOSIS — F331 Major depressive disorder, recurrent, moderate: Secondary | ICD-10-CM

## 2017-02-25 DIAGNOSIS — F4001 Agoraphobia with panic disorder: Secondary | ICD-10-CM | POA: Diagnosis not present

## 2017-02-25 MED ORDER — FLUOXETINE HCL 20 MG PO TABS: 20.0000 mg | ORAL_TABLET | Freq: Every day | ORAL | 0 refills | Status: DC

## 2017-02-25 MED ORDER — METHYLPHENIDATE HCL ER (OSM) 27 MG PO TBCR: 27.0000 mg | EXTENDED_RELEASE_TABLET | ORAL | 0 refills | Status: DC

## 2017-02-25 NOTE — Patient Instructions (Signed)
Fluoxetine capsules or tablets   FLUOXETINE (floo OX e teen) belongs to a class of drugs known as selective serotonin reuptake inhibitors (SSRIs). It helps to treat mood problems such as depression, obsessive compulsive disorder, and panic attacks. It can also treat certain eating disorders. This medicine may be used for other purposes; ask your health care provider or pharmacist if you have questions. COMMON BRAND NAME(S): Prozac What should I tell my health care provider before I take this medicine? They need to know if you have any of these conditions: -bipolar disorder or a family history of bipolar disorder -bleeding disorders -glaucoma -heart disease -liver disease -low levels of sodium in the blood -seizures -suicidal thoughts, plans, or attempt; a previous suicide attempt by you or a family member -take MAOIs like Carbex, Eldepryl, Marplan, Nardil, and Parnate -take medicines that treat or prevent blood clots -thyroid disease -an unusual or allergic reaction to fluoxetine, other medicines, foods, dyes, or preservatives -pregnant or trying to get pregnant -breast-feeding How should I use this medicine? Take this medicine by mouth with a glass of water. Follow the directions on the prescription label. You can take this medicine with or without food. Take your medicine at regular intervals. Do not take it more often than directed. Do not stop taking this medicine suddenly except upon the advice of your doctor. Stopping this medicine too quickly may cause serious side effects or your condition may worsen. A special MedGuide will be given to you by the pharmacist with each prescription and refill. Be sure to read this information carefully each time. Talk to your pediatrician regarding the use of this medicine in children. While this drug may be prescribed for children as young as 7 years for selected conditions, precautions do apply. Overdosage: If you think you have taken too much of  this medicine contact a poison control center or emergency room at once. NOTE: This medicine is only for you. Do not share this medicine with others. What if I miss a dose? If you miss a dose, skip the missed dose and go back to your regular dosing schedule. Do not take double or extra doses. What may interact with this medicine? Do not take this medicine with any of the following medications: -other medicines containing fluoxetine, like Sarafem or Symbyax -cisapride -linezolid -MAOIs like Carbex, Eldepryl, Marplan, Nardil, and Parnate -methylene blue (injected into a vein) -pimozide -thioridazine This medicine may also interact with the following medications: -alcohol -amphetamines -aspirin and aspirin-like medicines -carbamazepine -certain medicines for depression, anxiety, or psychotic disturbances -certain medicines for migraine headaches like almotriptan, eletriptan, frovatriptan, naratriptan, rizatriptan, sumatriptan, zolmitriptan -digoxin -diuretics -fentanyl -flecainide -furazolidone -isoniazid -lithium -medicines for sleep -medicines that treat or prevent blood clots like warfarin, enoxaparin, and dalteparin -NSAIDs, medicines for pain and inflammation, like ibuprofen or naproxen -phenytoin -procarbazine -propafenone -rasagiline -ritonavir -supplements like St. John's wort, kava kava, valerian -tramadol -tryptophan -vinblastine This list may not describe all possible interactions. Give your health care provider a list of all the medicines, herbs, non-prescription drugs, or dietary supplements you use. Also tell them if you smoke, drink alcohol, or use illegal drugs. Some items may interact with your medicine. What should I watch for while using this medicine? Tell your doctor if your symptoms do not get better or if they get worse. Visit your doctor or health care professional for regular checks on your progress. Because it may take several weeks to see the full  effects of this medicine, it is important to continue  your treatment as prescribed by your doctor. Patients and their families should watch out for new or worsening thoughts of suicide or depression. Also watch out for sudden changes in feelings such as feeling anxious, agitated, panicky, irritable, hostile, aggressive, impulsive, severely restless, overly excited and hyperactive, or not being able to sleep. If this happens, especially at the beginning of treatment or after a change in dose, call your health care professional. Bonita QuinYou may get drowsy or dizzy. Do not drive, use machinery, or do anything that needs mental alertness until you know how this medicine affects you. Do not stand or sit up quickly, especially if you are an older patient. This reduces the risk of dizzy or fainting spells. Alcohol may interfere with the effect of this medicine. Avoid alcoholic drinks. Your mouth may get dry. Chewing sugarless gum or sucking hard candy, and drinking plenty of water may help. Contact your doctor if the problem does not go away or is severe. This medicine may affect blood sugar levels. If you have diabetes, check with your doctor or health care professional before you change your diet or the dose of your diabetic medicine. What side effects may I notice from receiving this medicine? Side effects that you should report to your doctor or health care professional as soon as possible: -allergic reactions like skin rash, itching or hives, swelling of the face, lips, or tongue -anxious -black, tarry stools -breathing problems -changes in vision -confusion -elevated mood, decreased need for sleep, racing thoughts, impulsive behavior -eye pain -fast, irregular heartbeat -feeling faint or lightheaded, falls -feeling agitated, angry, or irritable -hallucination, loss of contact with reality -loss of balance or coordination -loss of memory -painful or prolonged erections -restlessness, pacing, inability to  keep still -seizures -stiff muscles -suicidal thoughts or other mood changes -trouble sleeping -unusual bleeding or bruising -unusually weak or tired -vomiting Side effects that usually do not require medical attention (report to your doctor or health care professional if they continue or are bothersome): -change in appetite or weight -change in sex drive or performance -diarrhea -dry mouth -headache -increased sweating -nausea -tremors This list may not describe all possible side effects. Call your doctor for medical advice about side effects. You may report side effects to FDA at 1-800-FDA-1088. Where should I keep my medicine? Keep out of the reach of children. Store at room temperature between 15 and 30 degrees C (59 and 86 degrees F). Throw away any unused medicine after the expiration date. NOTE: This sheet is a summary. It may not cover all possible information. If you have questions about this medicine, talk to your doctor, pharmacist, or health care provider.  2018 Elsevier/Gold Standard (2015-11-15 15:55:27)

## 2017-02-25 NOTE — Progress Notes (Signed)
Patient: Wesley Sharp Male    DOB: September 05, 1990   26 y.o.   MRN: [id number] Visit Date: 02/25/2017  Today's Provider: Margaretann LovelessJennifer M Burnette, PA-C   Chief Complaint  Patient presents with  . Anxiety   Subjective:    HPI Anxiety: Patient complains of anxiety disorder.  He has the following symptoms: difficulty concentrating, dizziness, fatigue, feelings of losing control, insomnia, irritable, palpitations, psychomotor agitation, racing thoughts, shortness of breath.  He denies current suicidal and homicidal ideation. Family history significant for no psychiatric illness.Possible organic causes contributing are: need medication. Risk factors: none   Patient was followed by Dr.Faheem,[facility] for panic disorder with agoraphobia and mild panic attacks and ADD, but he stopped going last year due to losing insurance when he turned 26. He had been on Concerta 27mg , Remeron 30mg  and Zyprexa 5mg . He reports the Concerta was working well. He states no true symptom resolution with remeron and zyprexa. He also has failed sertraline and depakote.   He reports he has been unemployed and is currently looking for a job. He reports he normally uses marijuana to treat his symptoms but since he is searching for a job he has stopped smoking marijuana 2 months ago. Since stopping he has noticed all his symptoms returning again. The last time he had symptom exacerbation was when he had to quit smoking marijuana also.   GAD 7 : Generalized Anxiety Score 02/25/2017  Nervous, Anxious, on Edge 3  Control/stop worrying 2  Worry too much - different things 2  Trouble relaxing 3  Restless 2  Easily annoyed or irritable 3  Afraid - awful might happen 1  Total GAD 7 Score 16  Anxiety Difficulty Extremely difficult      Allergies  Allergen Reactions  . Percocet [Oxycodone-Acetaminophen] Other (See Comments)  . Sulfa Antibiotics Rash     Current Outpatient Prescriptions:  .   cetirizine-pseudoephedrine (ZYRTEC-D) 5-120 MG tablet, Take 1 tablet by mouth 2 (two) times daily. (Patient not taking: Reported on 02/25/2017), Disp: 60 tablet, Rfl: 0 .  fluticasone (FLONASE) 50 MCG/ACT nasal spray, Place 2 sprays into both nostrils daily. (Patient not taking: Reported on 02/25/2017), Disp: 16 g, Rfl: 6 .  HYDROmorphone (DILAUDID) 4 MG tablet, Take 1 mg by mouth every 4 (four) hours as needed for severe pain., Disp: , Rfl:  .  meloxicam (MOBIC) 15 MG tablet, Take 1 mg by mouth daily., Disp: , Rfl:  .  methylphenidate (CONCERTA) 27 MG PO CR tablet, Take 1 tablet (27 mg total) by mouth every morning. (Patient not taking: Reported on 02/25/2017), Disp: 30 tablet, Rfl: 0 .  methylphenidate (CONCERTA) 27 MG PO CR tablet, Take 1 tablet (27 mg total) by mouth every morning. (Patient not taking: Reported on 02/25/2017), Disp: 30 tablet, Rfl: 0 .  mirtazapine (REMERON) 30 MG tablet, Take 1 tablet (30 mg total) by mouth at bedtime. (Patient not taking: Reported on 02/25/2017), Disp: 90 tablet, Rfl: 1 .  OLANZapine (ZYPREXA) 5 MG tablet, Take 1 tablet (5 mg total) by mouth at bedtime. (Patient not taking: Reported on 02/25/2017), Disp: 90 tablet, Rfl: 1 .  traMADol (ULTRAM) 50 MG tablet, Take 1 tablet (50 mg total) by mouth every 8 (eight) hours as needed. (Patient not taking: Reported on 02/25/2017), Disp: 30 tablet, Rfl: 0  Review of Systems  Constitutional: Positive for appetite change (no appetite).  Respiratory: Positive for choking (with the panic attacks) and chest tightness (with the panic attacks).  Negative for shortness of breath and wheezing.   Cardiovascular: Negative for chest pain, palpitations and leg swelling.  Gastrointestinal: Positive for nausea. Negative for abdominal pain.  Neurological: Negative for dizziness and headaches.  Psychiatric/Behavioral: Positive for agitation, behavioral problems, confusion, decreased concentration, dysphoric mood and sleep disturbance. Negative for  hallucinations, self-injury and suicidal ideas. The patient is nervous/anxious. The patient is not hyperactive.     Social History  Substance Use Topics  . Smoking status: Current Some Day Smoker    Packs/day: 0.25    Years: 4.00    Types: Cigarettes    Start date: 09/03/2008  . Smokeless tobacco: Never Used     Comment: last time he smoke was Nov 2017  . Alcohol use No     Comment: Very Rarely   Objective:   BP 110/70 (BP Location: Left Arm, Patient Position: Sitting, Cuff Size: Normal)   Pulse 60   Temp 98.1 F (36.7 C) (Oral)   Resp 16   Wt 140 lb 3.2 oz (63.6 kg)   SpO2 98%   BMI 18.50 kg/m    Physical Exam  Constitutional: He appears well-developed and well-nourished. No distress.  HENT:  Head: Normocephalic and atraumatic.  Neck: Normal range of motion. Neck supple. No JVD present. No tracheal deviation present. No thyromegaly present.  Cardiovascular: Normal rate, regular rhythm and normal heart sounds.  Exam reveals no gallop and no friction rub.   No murmur heard. Pulmonary/Chest: Effort normal and breath sounds normal. No respiratory distress. He has no wheezes. He has no rales.  Lymphadenopathy:    He has no cervical adenopathy.  Skin: He is not diaphoretic.  Psychiatric: His behavior is normal. Judgment and thought content normal. His mood appears anxious. His speech is rapid and/or pressured. Cognition and memory are normal. He exhibits a depressed mood. He expresses no homicidal and no suicidal ideation.  Vitals reviewed.       Assessment & Plan:     1. Attention deficit hyperactivity disorder (ADHD), predominantly inattentive type I will restart Concerta as below as he has done well with this medication and dosing before. I will see him back in 4 weeks to recheck.  - methylphenidate (CONCERTA) 27 MG PO CR tablet; Take 1 tablet (27 mg total) by mouth every morning.  Dispense: 30 tablet; Refill: 0  2. Moderate episode of recurrent major depressive disorder  (HCC) Has been intolerant to sertraline and depakote (made him nauseous). Was on remeron 30mg  for about 9 months but reports it never truly helped his symptoms. Was also on zyprexa 5mg  without complete relief. I will start prozac as below and see if he is able to tolerate. I will see him back in 4 weeks.  - FLUoxetine (PROZAC) 20 MG tablet; Take 1 tablet (20 mg total) by mouth daily.  Dispense: 30 tablet; Refill: 0  3. Panic disorder with agoraphobia and mild panic attacks See above medical treatment plan. - FLUoxetine (PROZAC) 20 MG tablet; Take 1 tablet (20 mg total) by mouth daily.  Dispense: 30 tablet; Refill: 0       Margaretann Loveless, PA-C  [facility]

## 2017-03-24 ENCOUNTER — Other Ambulatory Visit: Payer: Self-pay | Admitting: Physician Assistant

## 2017-03-24 DIAGNOSIS — F331 Major depressive disorder, recurrent, moderate: Secondary | ICD-10-CM

## 2017-03-24 DIAGNOSIS — F4001 Agoraphobia with panic disorder: Secondary | ICD-10-CM

## 2017-03-25 ENCOUNTER — Ambulatory Visit (INDEPENDENT_AMBULATORY_CARE_PROVIDER_SITE_OTHER): Payer: BLUE CROSS/BLUE SHIELD | Admitting: Physician Assistant

## 2017-03-25 ENCOUNTER — Encounter: Payer: Self-pay | Admitting: Physician Assistant

## 2017-03-25 DIAGNOSIS — F331 Major depressive disorder, recurrent, moderate: Secondary | ICD-10-CM

## 2017-03-25 DIAGNOSIS — F900 Attention-deficit hyperactivity disorder, predominantly inattentive type: Secondary | ICD-10-CM | POA: Diagnosis not present

## 2017-03-25 DIAGNOSIS — F4001 Agoraphobia with panic disorder: Secondary | ICD-10-CM | POA: Diagnosis not present

## 2017-03-25 MED ORDER — FLUOXETINE HCL 20 MG PO TABS: 20.0000 mg | ORAL_TABLET | Freq: Every day | ORAL | 1 refills | Status: DC

## 2017-03-25 MED ORDER — METHYLPHENIDATE HCL ER (OSM) 27 MG PO TBCR: 27.0000 mg | EXTENDED_RELEASE_TABLET | ORAL | 0 refills | Status: DC

## 2017-03-25 NOTE — Progress Notes (Signed)
Patient: Wesley Sharp Male    DOB: 27-Jul-1990   26 y.o.   MRN: [id number] Visit Date: 03/25/2017  Today's Provider: Margaretann Loveless, PA-C   Chief Complaint  Patient presents with  . Follow-up    ADHD,Depression and Panic Disorder   Subjective:    HPI Patient is here today for 4 week follow-up ADHD, Depression, and Panic Disorder.   ADHD, Follow up:  The patient was last seen for ADHD 4 weeks ago. Changes made since that visit include restart Concerta.  He reports excellent compliance with treatment. He is not having side effects. Marland Kitchen  ------------------------------------------------------------------------  Depression and Panic Disorder, Follow up:  The patient was last seen for Depression and Panic Disorder 4 weeks ago. Changes made since that visit include started Prozac.  He reports excellent compliance with treatment. He is not having side effects. Marland Kitchen  ------------------------------------------------------------------------    Allergies  Allergen Reactions  . Percocet [Oxycodone-Acetaminophen] Other (See Comments)  . Sulfa Antibiotics Rash     Current Outpatient Prescriptions:  .  FLUoxetine (PROZAC) 20 MG tablet, TAKE 1 TABLET BY MOUTH EVERY DAY, Disp: 30 tablet, Rfl: 0 .  methylphenidate (CONCERTA) 27 MG PO CR tablet, Take 1 tablet (27 mg total) by mouth every morning., Disp: 30 tablet, Rfl: 0  Review of Systems  Constitutional: Negative.   Respiratory: Negative.   Cardiovascular: Negative.   Psychiatric/Behavioral: Positive for decreased concentration (improving). Negative for dysphoric mood, self-injury, sleep disturbance and suicidal ideas. The patient is nervous/anxious (improving).     Social History  Substance Use Topics  . Smoking status: Current Some Day Smoker    Packs/day: 0.25    Years: 4.00    Types: Cigarettes    Start date: 09/03/2008  . Smokeless tobacco: Never Used     Comment: last time he smoke was Nov 2017  . Alcohol use  No     Comment: Very Rarely   Objective:   BP 120/70 (BP Location: Left Arm, Patient Position: Sitting, Cuff Size: Normal)   Pulse 79   Temp 98.2 F (36.8 C) (Oral)   Resp 16   Wt 142 lb (64.4 kg)   BMI 18.73 kg/m    Physical Exam  Constitutional: He appears well-developed and well-nourished. No distress.  HENT:  Head: Normocephalic and atraumatic.  Cardiovascular: Normal rate, regular rhythm and normal heart sounds.  Exam reveals no gallop and no friction rub.   No murmur heard. Pulmonary/Chest: Effort normal and breath sounds normal. No respiratory distress. He has no wheezes. He has no rales.  Skin: He is not diaphoretic.  Psychiatric: He has a normal mood and affect. His behavior is normal. Judgment and thought content normal.  Vitals reviewed.      Assessment & Plan:     1. Moderate episode of recurrent major depressive disorder (HCC) Improving. Panic attacks down to one per week vs 1-2 per day. Continue current medical treatment plan with Prozac . - FLUoxetine (PROZAC) 20 MG tablet; Take 1 tablet (20 mg total) by mouth daily.  Dispense: 90 tablet; Refill: 1  2. Panic disorder with agoraphobia and mild panic attacks Improving. Continue current medical treatment plan with Prozac . - FLUoxetine (PROZAC) 20 MG tablet; Take 1 tablet (20 mg total) by mouth daily.  Dispense: 90 tablet; Refill: 1  3. Attention deficit hyperactivity disorder (ADHD), predominantly inattentive type Somewhat improved. Will try one more month at this dose. He is to call in 4 weeks if symptoms  still not fully controlled and will increase the dose. I will then see him back in 8 weeks to see how dosing is working for him.  - methylphenidate (CONCERTA) 27 MG PO CR tablet; Take 1 tablet (27 mg total) by mouth every morning.  Dispense: 30 tablet; Refill: 0       Margaretann Loveless, PA-C  [facility]

## 2017-03-25 NOTE — Patient Instructions (Signed)
Methylphenidate extended-release tablets What is this medicine? METHYLPHENIDATE (meth il FEN i date) is used to treat attention-deficit hyperactivity disorder (ADHD). It is also used to treat narcolepsy. This medicine may be used for other purposes; ask your health care provider or pharmacist if you have questions. COMMON BRAND NAME(S): Concerta, Metadate ER, Methylin, Ritalin SR What should I tell my health care provider before I take this medicine? They need to know if you have any of these conditions: -anxiety or panic attacks -circulation problems in fingers and toes -difficulty swallowing, problems with the esophagus, or a history of blockage of the stomach or intestines -glaucoma -hardening or blockages of the arteries or heart blood vessels -heart disease or a heart defect -high blood pressure -history of a drug or alcohol abuse problem -history of stroke -liver disease -mental illness -motor tics, family history or diagnosis of Tourette's syndrome -seizures -suicidal thoughts, plans, or attempt; a previous suicide attempt by you or a family member -thyroid disease -an unusual or allergic reaction to methylphenidate, other medicines, foods, dyes, or preservatives -pregnant or trying to get pregnant -breast-feeding How should I use this medicine? Take this medicine by mouth with a glass of water. Follow the directions on the prescription label. Do not crush, cut, or chew the tablet. You may take this medicine with food. Take your medicine at regular intervals. Do not take it more often than directed. If you take your medicine more than once a day, try to take your last dose at least 8 hours before bedtime. This well help prevent the medicine from interfering with your sleep. A special MedGuide will be given to you by the pharmacist with each prescription and refill. Be sure to read this information carefully each time. Talk to your pediatrician regarding the use of this medicine in  children. While this drug may be prescribed for children as young as 6 years for selected conditions, precautions do apply. Overdosage: If you think you have taken too much of this medicine contact a poison control center or emergency room at once. NOTE: This medicine is only for you. Do not share this medicine with others. What if I miss a dose? If you miss a dose, take it as soon as you can. If it is almost time for your next dose, take only that dose. Do not take double or extra doses. What may interact with this medicine? Do not take this medicine with any of the following medications: -lithium -MAOIs like Carbex, Eldepryl, Marplan, Nardil, and Parnate -other stimulant medicines for attention disorders, weight loss, or to stay awake -procarbazine This medicine may also interact with the following medications: -atomoxetine -caffeine -certain medicines for blood pressure, heart disease, irregular heart beat -certain medicines for depression, anxiety, or psychotic disturbances -certain medicines for seizures like carbamazepine, phenobarbital, phenytoin -cold or allergy medicines -warfarin This list may not describe all possible interactions. Give your health care provider a list of all the medicines, herbs, non-prescription drugs, or dietary supplements you use. Also tell them if you smoke, drink alcohol, or use illegal drugs. Some items may interact with your medicine. What should I watch for while using this medicine? Visit your doctor or health care professional for regular checks on your progress. This prescription requires that you follow special procedures with your doctor and pharmacy. You will need to have a new written prescription from your doctor or health care professional every time you need a refill. This medicine may affect your concentration, or hide signs of tiredness. Until   you know how this drug affects you, do not drive, ride a bicycle, use machinery, or do anything that  needs mental alertness. Tell your doctor or health care professional if this medicine loses its effects, or if you feel you need to take more than the prescribed amount. Do not change the dosage without talking to your doctor or health care professional. For males, contact your doctor or health care professional right away if you have an erection that lasts longer than 4 hours or if it becomes painful. This may be a sign of a serious problem and must be treated right away to prevent permanent damage. Decreased appetite is a common side effect when starting this medicine. Eating small, frequent meals or snacks can help. Talk to your doctor if you continue to have poor eating habits. Height and weight growth of a child taking this medicine will be monitored closely. Do not take this medicine close to bedtime. It may prevent you from sleeping. The tablet shell for some brands of this medicine does not dissolve. This is normal. The tablet shell may appear whole in the stool. This is not a cause for concern. If you are going to need surgery, a MRI, CT scan, or other procedure, tell your doctor that you are taking this medicine. You may need to stop taking this medicine before the procedure. Tell your doctor or healthcare professional right away if you notice unexplained wounds on your fingers and toes while taking this medicine. You should also tell your healthcare provider if you experience numbness or pain, changes in the skin color, or sensitivity to temperature in your fingers or toes. What side effects may I notice from receiving this medicine? Side effects that you should report to your doctor or health care professional as soon as possible: -allergic reactions like skin rash, itching or hives, swelling of the face, lips, or tongue -changes in vision -chest pain or chest tightness -fast, irregular heartbeat -fingers or toes feel numb, cool, painful -hallucination, loss of contact with reality -high  blood pressure -males: prolonged or painful erection -seizures -severe headaches -severe stomach pain, vomiting -shortness of breath -suicidal thoughts or other mood changes -trouble swallowing -trouble walking, dizziness, loss of balance or coordination -uncontrollable head, mouth, neck, arm, or leg movements -unusual bleeding or bruising Side effects that usually do not require medical attention (report to your doctor or health care professional if they continue or are bothersome): -anxious -headache -loss of appetite -nausea -trouble sleeping -weight loss This list may not describe all possible side effects. Call your doctor for medical advice about side effects. You may report side effects to FDA at 1-800-FDA-1088. Where should I keep my medicine? Keep out of the reach of children. This medicine can be abused. Keep your medicine in a safe place to protect it from theft. Do not share this medicine with anyone. Selling or giving away this medicine is dangerous and against the law. This medicine may cause accidental overdose and death if taken by other adults, children, or pets. Mix any unused medicine with a substance like cat litter or coffee grounds. Then throw the medicine away in a sealed container like a sealed bag or a coffee can with a lid. Do not use the medicine after the expiration date. Store at room temperature between 15 and 30 degrees C (59 and 86 degrees F). Protect from light and moisture. Keep container tightly closed. NOTE: This sheet is a summary. It may not cover all possible information. If   you have questions about this medicine, talk to your doctor, pharmacist, or health care provider.  2018 Elsevier/Gold Standard (2014-03-05 15:32:32)  

## 2017-04-25 ENCOUNTER — Other Ambulatory Visit: Payer: Self-pay | Admitting: Physician Assistant

## 2017-04-25 DIAGNOSIS — F900 Attention-deficit hyperactivity disorder, predominantly inattentive type: Secondary | ICD-10-CM

## 2017-04-25 MED ORDER — METHYLPHENIDATE HCL ER (OSM) 27 MG PO TBCR: 27.0000 mg | EXTENDED_RELEASE_TABLET | ORAL | 0 refills | Status: DC

## 2017-04-25 NOTE — Telephone Encounter (Signed)
Pt's mom Melissa contacted office for refill request on the following medications:  methylphenidate (CONCERTA) 27 MG PO CR tablet   Last Rx: 03/25/17 LOV: 03/25/17  Please advise. Thanks TNP

## 2017-04-25 NOTE — Telephone Encounter (Signed)
NCCSR reviewed. Concerta 27mg  refilled x 3. Patient should not need refills until 07/23/17.

## 2017-04-25 NOTE — Telephone Encounter (Signed)
Melissa advised that prescriptions printed and placed up front ready for pick up.  Thanks,  -Joseline

## 2017-04-27 ENCOUNTER — Other Ambulatory Visit: Payer: Self-pay | Admitting: Physician Assistant

## 2017-04-27 DIAGNOSIS — F331 Major depressive disorder, recurrent, moderate: Secondary | ICD-10-CM

## 2017-04-27 DIAGNOSIS — F4001 Agoraphobia with panic disorder: Secondary | ICD-10-CM

## 2017-05-13 ENCOUNTER — Ambulatory Visit: Payer: BLUE CROSS/BLUE SHIELD | Admitting: Physician Assistant

## 2017-05-13 ENCOUNTER — Encounter: Payer: Self-pay | Admitting: Physician Assistant

## 2017-05-13 VITALS — BP 120/80 | HR 83 | Temp 98.0°F | Resp 16 | Ht 72.0 in | Wt 147.0 lb

## 2017-05-13 DIAGNOSIS — F331 Major depressive disorder, recurrent, moderate: Secondary | ICD-10-CM

## 2017-05-13 DIAGNOSIS — F411 Generalized anxiety disorder: Secondary | ICD-10-CM | POA: Diagnosis not present

## 2017-05-13 DIAGNOSIS — F900 Attention-deficit hyperactivity disorder, predominantly inattentive type: Secondary | ICD-10-CM

## 2017-05-13 DIAGNOSIS — F909 Attention-deficit hyperactivity disorder, unspecified type: Secondary | ICD-10-CM | POA: Insufficient documentation

## 2017-05-13 MED ORDER — METHYLPHENIDATE HCL ER 36 MG PO TB24: 36.0000 mg | ORAL_TABLET | Freq: Every day | ORAL | 0 refills | Status: DC

## 2017-05-13 MED ORDER — FLUOXETINE HCL 40 MG PO CAPS
40.0000 mg | ORAL_CAPSULE | Freq: Every day | ORAL | 5 refills | Status: DC
Start: 2017-05-13 — End: 2017-06-01

## 2017-05-13 NOTE — Patient Instructions (Signed)
Methylphenidate extended-release tablets What is this medicine? METHYLPHENIDATE (meth il FEN i date) is used to treat attention-deficit hyperactivity disorder (ADHD). It is also used to treat narcolepsy. This medicine may be used for other purposes; ask your health care provider or pharmacist if you have questions. COMMON BRAND NAME(S): Concerta, Metadate ER, Methylin, Ritalin SR What should I tell my health care provider before I take this medicine? They need to know if you have any of these conditions: -anxiety or panic attacks -circulation problems in fingers and toes -difficulty swallowing, problems with the esophagus, or a history of blockage of the stomach or intestines -glaucoma -hardening or blockages of the arteries or heart blood vessels -heart disease or a heart defect -high blood pressure -history of a drug or alcohol abuse problem -history of stroke -liver disease -mental illness -motor tics, family history or diagnosis of Tourette's syndrome -seizures -suicidal thoughts, plans, or attempt; a previous suicide attempt by you or a family member -thyroid disease -an unusual or allergic reaction to methylphenidate, other medicines, foods, dyes, or preservatives -pregnant or trying to get pregnant -breast-feeding How should I use this medicine? Take this medicine by mouth with a glass of water. Follow the directions on the prescription label. Do not crush, cut, or chew the tablet. You may take this medicine with food. Take your medicine at regular intervals. Do not take it more often than directed. If you take your medicine more than once a day, try to take your last dose at least 8 hours before bedtime. This well help prevent the medicine from interfering with your sleep. A special MedGuide will be given to you by the pharmacist with each prescription and refill. Be sure to read this information carefully each time. Talk to your pediatrician regarding the use of this medicine in  children. While this drug may be prescribed for children as young as 6 years for selected conditions, precautions do apply. Overdosage: If you think you have taken too much of this medicine contact a poison control center or emergency room at once. NOTE: This medicine is only for you. Do not share this medicine with others. What if I miss a dose? If you miss a dose, take it as soon as you can. If it is almost time for your next dose, take only that dose. Do not take double or extra doses. What may interact with this medicine? Do not take this medicine with any of the following medications: -lithium -MAOIs like Carbex, Eldepryl, Marplan, Nardil, and Parnate -other stimulant medicines for attention disorders, weight loss, or to stay awake -procarbazine This medicine may also interact with the following medications: -atomoxetine -caffeine -certain medicines for blood pressure, heart disease, irregular heart beat -certain medicines for depression, anxiety, or psychotic disturbances -certain medicines for seizures like carbamazepine, phenobarbital, phenytoin -cold or allergy medicines -warfarin This list may not describe all possible interactions. Give your health care provider a list of all the medicines, herbs, non-prescription drugs, or dietary supplements you use. Also tell them if you smoke, drink alcohol, or use illegal drugs. Some items may interact with your medicine. What should I watch for while using this medicine? Visit your doctor or health care professional for regular checks on your progress. This prescription requires that you follow special procedures with your doctor and pharmacy. You will need to have a new written prescription from your doctor or health care professional every time you need a refill. This medicine may affect your concentration, or hide signs of tiredness. Until   you know how this drug affects you, do not drive, ride a bicycle, use machinery, or do anything that  needs mental alertness. Tell your doctor or health care professional if this medicine loses its effects, or if you feel you need to take more than the prescribed amount. Do not change the dosage without talking to your doctor or health care professional. For males, contact your doctor or health care professional right away if you have an erection that lasts longer than 4 hours or if it becomes painful. This may be a sign of a serious problem and must be treated right away to prevent permanent damage. Decreased appetite is a common side effect when starting this medicine. Eating small, frequent meals or snacks can help. Talk to your doctor if you continue to have poor eating habits. Height and weight growth of a child taking this medicine will be monitored closely. Do not take this medicine close to bedtime. It may prevent you from sleeping. The tablet shell for some brands of this medicine does not dissolve. This is normal. The tablet shell may appear whole in the stool. This is not a cause for concern. If you are going to need surgery, a MRI, CT scan, or other procedure, tell your doctor that you are taking this medicine. You may need to stop taking this medicine before the procedure. Tell your doctor or healthcare professional right away if you notice unexplained wounds on your fingers and toes while taking this medicine. You should also tell your healthcare provider if you experience numbness or pain, changes in the skin color, or sensitivity to temperature in your fingers or toes. What side effects may I notice from receiving this medicine? Side effects that you should report to your doctor or health care professional as soon as possible: -allergic reactions like skin rash, itching or hives, swelling of the face, lips, or tongue -changes in vision -chest pain or chest tightness -fast, irregular heartbeat -fingers or toes feel numb, cool, painful -hallucination, loss of contact with reality -high  blood pressure -males: prolonged or painful erection -seizures -severe headaches -severe stomach pain, vomiting -shortness of breath -suicidal thoughts or other mood changes -trouble swallowing -trouble walking, dizziness, loss of balance or coordination -uncontrollable head, mouth, neck, arm, or leg movements -unusual bleeding or bruising Side effects that usually do not require medical attention (report to your doctor or health care professional if they continue or are bothersome): -anxious -headache -loss of appetite -nausea -trouble sleeping -weight loss This list may not describe all possible side effects. Call your doctor for medical advice about side effects. You may report side effects to FDA at 1-800-FDA-1088. Where should I keep my medicine? Keep out of the reach of children. This medicine can be abused. Keep your medicine in a safe place to protect it from theft. Do not share this medicine with anyone. Selling or giving away this medicine is dangerous and against the law. This medicine may cause accidental overdose and death if taken by other adults, children, or pets. Mix any unused medicine with a substance like cat litter or coffee grounds. Then throw the medicine away in a sealed container like a sealed bag or a coffee can with a lid. Do not use the medicine after the expiration date. Store at room temperature between 15 and 30 degrees C (59 and 86 degrees F). Protect from light and moisture. Keep container tightly closed. NOTE: This sheet is a summary. It may not cover all possible information. If   you have questions about this medicine, talk to your doctor, pharmacist, or health care provider.  2018 Elsevier/Gold Standard (2014-03-05 15:32:32)  

## 2017-05-13 NOTE — Progress Notes (Signed)
Patient: Wesley Sharp Male    DOB: September 27, 1990   26 y.o.   MRN: [id number] Visit Date: 05/13/2017  Today's Provider: Margaretann LovelessJennifer M Anabeth Chilcott, PA-C   Chief Complaint  Patient presents with  . Depression   Subjective:    HPI  Follow up for depression  The patient was last seen for this 2 months ago. Changes made at last visit include no change, patient to continue current medications.  He reports excellent compliance with treatment. He feels that condition is Improved. Patient reports he is having "a lot less anxiety attacks." He does report he is still having anxiety attacks however and is interested in increasing Prozac. He is not having side effects.   He is also requesting to increase his Concerta. He is currently on Concerta 27mg . He reports continued inattentiveness. He has been trying to get a job recently and is having trouble even completing an application. His mother does accompany him today. She does agree that he has had improvements, but still has "bad times" at least once or twice per week with breakthrough symptoms.  ------------------------------------------------------------------------------------    Allergies  Allergen Reactions  . Percocet [Oxycodone-Acetaminophen] Other (See Comments)  . Sulfa Antibiotics Rash     Current Outpatient Medications:  .  FLUoxetine (PROZAC) 20 MG tablet, TAKE 1 TABLET BY MOUTH EVERY DAY, Disp: 30 tablet, Rfl: 5 .  methylphenidate (CONCERTA) 27 MG PO CR tablet, Take 1 tablet (27 mg total) by mouth every morning. Please do NOT fill until 06/23/17, Disp: 30 tablet, Rfl: 0  Review of Systems  Constitutional: Negative.   Respiratory: Negative.   Cardiovascular: Negative.   Neurological: Negative.   Psychiatric/Behavioral: Positive for decreased concentration. Negative for agitation, dysphoric mood, self-injury, sleep disturbance and suicidal ideas. The patient is nervous/anxious.     Social History   Tobacco Use  .  Smoking status: Current Some Day Smoker    Packs/day: 0.25    Years: 4.00    Pack years: 1.00    Types: Cigarettes    Start date: 09/03/2008  . Smokeless tobacco: Never Used  . Tobacco comment: last time he smoke was Nov 2017  Substance Use Topics  . Alcohol use: No    Alcohol/week: 0.0 oz    Comment: Very Rarely   Objective:   BP 120/80 (BP Location: Left Arm, Patient Position: Sitting, Cuff Size: Normal)   Pulse 83   Temp 98 F (36.7 C) (Oral)   Resp 16   Ht 6' (1.829 m)   Wt 147 lb (66.7 kg)   SpO2 98%   BMI 19.94 kg/m  Vitals:   05/13/17 1639  BP: 120/80  Pulse: 83  Resp: 16  Temp: 98 F (36.7 C)  TempSrc: Oral  SpO2: 98%  Weight: 147 lb (66.7 kg)  Height: 6' (1.829 m)     Physical Exam  Constitutional: He appears well-developed and well-nourished. No distress.  HENT:  Head: Normocephalic and atraumatic.  Neck: Normal range of motion. Neck supple.  Cardiovascular: Normal rate, regular rhythm and normal heart sounds. Exam reveals no gallop and no friction rub.  No murmur heard. Pulmonary/Chest: Effort normal and breath sounds normal. No respiratory distress. He has no wheezes. He has no rales.  Skin: He is not diaphoretic.  Psychiatric: His speech is normal and behavior is normal. Judgment and thought content normal. His mood appears anxious. Cognition and memory are normal.  Vitals reviewed.      Assessment & Plan:  1. Moderate episode of recurrent major depressive disorder (HCC) Will increase fluoxetine to 40mg  as below. He is to call if he develops side effects with increase or if he still has breakthrough panic attacks.  - FLUoxetine (PROZAC) 40 MG capsule; Take 1 capsule (40 mg total) daily by mouth.  Dispense: 30 capsule; Refill: 5  2. GAD (generalized anxiety disorder) See above medical treatment plan. - FLUoxetine (PROZAC) 40 MG capsule; Take 1 capsule (40 mg total) daily by mouth.  Dispense: 30 capsule; Refill: 5  3. Attention deficit  hyperactivity disorder (ADHD), predominantly inattentive type Still not completely controlled. Will increase to 36mg  as below. [facility] the 2 prescriptions he has on file and made them aware of the dose increase. He is to call if symptoms are still not completely controlled.  - methylphenidate 36 MG PO CR tablet; Take 1 tablet (36 mg total) daily by mouth.  Dispense: 30 tablet; Refill: 0       Margaretann LovelessJennifer M Staceyann Knouff, PA-C  [facility]

## 2017-06-01 ENCOUNTER — Other Ambulatory Visit: Payer: Self-pay | Admitting: Physician Assistant

## 2017-06-01 DIAGNOSIS — F411 Generalized anxiety disorder: Secondary | ICD-10-CM

## 2017-06-01 DIAGNOSIS — F331 Major depressive disorder, recurrent, moderate: Secondary | ICD-10-CM

## 2017-06-01 MED ORDER — FLUOXETINE HCL 40 MG PO CAPS: 40.0000 mg | ORAL_CAPSULE | Freq: Every day | ORAL | 1 refills | Status: DC

## 2017-06-01 NOTE — Telephone Encounter (Signed)
[facility] request for a 90-days supply for the following medication. Thanks CC ° °FLUoxetine (PROZAC) 40 MG capsule  ° °

## 2017-06-22 ENCOUNTER — Telehealth: Payer: Self-pay | Admitting: Physician Assistant

## 2017-06-22 DIAGNOSIS — F900 Attention-deficit hyperactivity disorder, predominantly inattentive type: Secondary | ICD-10-CM

## 2017-06-22 MED ORDER — METHYLPHENIDATE HCL ER 36 MG PO TB24: 36.0000 mg | ORAL_TABLET | Freq: Every day | ORAL | 0 refills | Status: DC

## 2017-06-22 NOTE — Telephone Encounter (Signed)
Ds refill on his concerta.  Please call when ready to pick up  Call back [phone]  [facility]

## 2017-06-22 NOTE — Telephone Encounter (Signed)
NCCSR reviewed. Rx printed. 

## 2017-06-22 NOTE — Telephone Encounter (Signed)
Patients mother advised  

## 2017-07-26 ENCOUNTER — Telehealth: Payer: Self-pay | Admitting: Physician Assistant

## 2017-07-26 DIAGNOSIS — F900 Attention-deficit hyperactivity disorder, predominantly inattentive type: Secondary | ICD-10-CM

## 2017-07-26 NOTE — Telephone Encounter (Signed)
Pt's mom Melissa contacted office for refill request on the following medications:  methylphenidate 36 MG PO CR tablet   Last Rx: 06/22/17 LOV: 05/13/17  Please advise. Thanks TNP

## 2017-07-27 MED ORDER — METHYLPHENIDATE HCL ER 36 MG PO TB24: 36.0000 mg | ORAL_TABLET | Freq: Every day | ORAL | 0 refills | Status: DC

## 2017-07-27 NOTE — Telephone Encounter (Signed)
Printed x 1 month. Will make sure dose is still working well, then will give 3 month at next Rx need.

## 2017-07-27 NOTE — Telephone Encounter (Signed)
Melissa Schwandt advised that prescription for Wesley Sharp is placed up front ready for pick up.  Thanks,  -Joseline

## 2017-08-24 ENCOUNTER — Telehealth: Payer: Self-pay | Admitting: Physician Assistant

## 2017-08-24 DIAGNOSIS — F900 Attention-deficit hyperactivity disorder, predominantly inattentive type: Secondary | ICD-10-CM

## 2017-08-24 MED ORDER — METHYLPHENIDATE HCL ER 36 MG PO TB24: 36.0000 mg | ORAL_TABLET | Freq: Every day | ORAL | 0 refills | Status: DC

## 2017-08-24 NOTE — Telephone Encounter (Signed)
Spoke with Lewis MoccasinMelissa Meske that patient's prescription are placed up front ready for pick up.  Thanks,  -Enza Shone

## 2017-08-24 NOTE — Telephone Encounter (Signed)
Patient mom is requesting refill for his methylphenidate 36 MG PO CR tablet  Wants to see if can get it for 3 months

## 2017-08-24 NOTE — Telephone Encounter (Signed)
Refilled x 3 months 

## 2017-09-29 ENCOUNTER — Ambulatory Visit: Payer: BLUE CROSS/BLUE SHIELD | Admitting: Physician Assistant

## 2017-09-29 ENCOUNTER — Encounter: Payer: Self-pay | Admitting: Physician Assistant

## 2017-09-29 VITALS — BP 110/70 | HR 81 | Resp 16 | Wt 152.0 lb

## 2017-09-29 DIAGNOSIS — F411 Generalized anxiety disorder: Secondary | ICD-10-CM

## 2017-09-29 DIAGNOSIS — F331 Major depressive disorder, recurrent, moderate: Secondary | ICD-10-CM

## 2017-09-29 DIAGNOSIS — F900 Attention-deficit hyperactivity disorder, predominantly inattentive type: Secondary | ICD-10-CM | POA: Diagnosis not present

## 2017-09-29 MED ORDER — FLUOXETINE HCL 20 MG PO CAPS: 20.0000 mg | ORAL_CAPSULE | Freq: Every day | ORAL | 0 refills | Status: DC

## 2017-09-29 MED ORDER — VORTIOXETINE HBR 10 MG PO TABS: 10.0000 mg | ORAL_TABLET | Freq: Every day | ORAL | 1 refills | Status: DC

## 2017-09-29 NOTE — Progress Notes (Signed)
Patient: Wesley MilianWeston B Twombly Male    DOB: December 03, 1990   26 y.o.   MRN: [id number] Visit Date: 09/29/2017  Today's Provider: Margaretann LovelessJennifer M Danamarie Minami, PA-C   Chief Complaint  Patient presents with  . Anxiety   Subjective:    HPI Patient here today with c.o anxiety. Reports last week was the worst. Patient reports having a mood swings, feeling angry, hard to concentrate at work. Patient is currently on Fluoxetine 40mg  for Depression and anxiety and on Methylphenidate 36mg  for ADHD. Patient's main complaint and cause for worsening anxiety is due to having issues with ejaculation, not being able to with Fluoxetine.      Allergies  Allergen Reactions  . Percocet [Oxycodone-Acetaminophen] Other (See Comments)  . Sulfa Antibiotics Rash     Current Outpatient Medications:  .  FLUoxetine (PROZAC) 40 MG capsule, Take 1 capsule (40 mg total) by mouth daily., Disp: 90 capsule, Rfl: 1 .  methylphenidate 36 MG PO CR tablet, Take 1 tablet (36 mg total) by mouth daily. Please do NOT fill until 10/20/17, Disp: 30 tablet, Rfl: 0  Review of Systems  Constitutional: Negative.   Respiratory: Positive for chest tightness (during panic attack).   Cardiovascular: Positive for palpitations.  Psychiatric/Behavioral: Positive for decreased concentration and dysphoric mood. The patient is nervous/anxious.     Social History   Tobacco Use  . Smoking status: Current Some Day Smoker    Packs/day: 0.25    Years: 4.00    Pack years: 1.00    Types: Cigarettes    Start date: 09/03/2008  . Smokeless tobacco: Never Used  . Tobacco comment: last time he smoke was Nov 2017  Substance Use Topics  . Alcohol use: No    Alcohol/week: 0.0 oz    Comment: Very Rarely   Objective:   BP 110/70 (BP Location: Left Arm, Patient Position: Sitting, Cuff Size: Normal)   Pulse 81   Resp 16   Wt 152 lb (68.9 kg)   BMI 20.61 kg/m    Physical Exam  Constitutional: He appears well-developed and well-nourished. No distress.    HENT:  Head: Normocephalic and atraumatic.  Neck: Normal range of motion. Neck supple.  Cardiovascular: Normal rate, regular rhythm and normal heart sounds. Exam reveals no gallop and no friction rub.  No murmur heard. Pulmonary/Chest: Effort normal and breath sounds normal. No respiratory distress. He has no wheezes. He has no rales.  Skin: He is not diaphoretic.  Psychiatric: He has a normal mood and affect. His behavior is normal. Judgment and thought content normal.  Vitals reviewed.      Assessment & Plan:     1. Attention deficit hyperactivity disorder (ADHD), predominantly inattentive type I feel symptoms are worsening secondary to worsening anxiety. Will change therapy for the anxiety and depression first as below then readdress in 6 weeks.   2. Moderate episode of recurrent major depressive disorder (HCC) Fluoxetine 20mg  sent as tapering dose. Samples of trintellix (2 weeks) were given to patient to start. Trintellix also ordered as below. I will see him back in 6 weeks to recheck and see how he is tolerating the medication.  - FLUoxetine (PROZAC) 20 MG capsule; Take 1 capsule (20 mg total) by mouth daily.  Dispense: 7 capsule; Refill: 0 - vortioxetine HBr (TRINTELLIX) 10 MG TABS tablet; Take 1 tablet (10 mg total) by mouth daily.  Dispense: 90 tablet; Refill: 1  3. GAD (generalized anxiety disorder) See above medical treatment plan. - FLUoxetine (  PROZAC) 20 MG capsule; Take 1 capsule (20 mg total) by mouth daily.  Dispense: 7 capsule; Refill: 0 - vortioxetine HBr (TRINTELLIX) 10 MG TABS tablet; Take 1 tablet (10 mg total) by mouth daily.  Dispense: 90 tablet; Refill: 1       Margaretann Loveless, PA-C  [facility]

## 2017-09-29 NOTE — Patient Instructions (Signed)
Fluoxetine 20mg  x 1 week then discontinue, start trintellix the day following the last day of fluoxetine.

## 2017-11-10 ENCOUNTER — Ambulatory Visit: Payer: BLUE CROSS/BLUE SHIELD | Admitting: Physician Assistant

## 2017-11-10 ENCOUNTER — Encounter: Payer: Self-pay | Admitting: Physician Assistant

## 2017-11-10 DIAGNOSIS — F900 Attention-deficit hyperactivity disorder, predominantly inattentive type: Secondary | ICD-10-CM

## 2017-11-10 DIAGNOSIS — F411 Generalized anxiety disorder: Secondary | ICD-10-CM

## 2017-11-10 DIAGNOSIS — F331 Major depressive disorder, recurrent, moderate: Secondary | ICD-10-CM | POA: Diagnosis not present

## 2017-11-10 MED ORDER — METHYLPHENIDATE HCL ER 36 MG PO TB24: 36.0000 mg | ORAL_TABLET | Freq: Every day | ORAL | 0 refills | Status: DC

## 2017-11-10 MED ORDER — VORTIOXETINE HBR 10 MG PO TABS: 10.0000 mg | ORAL_TABLET | Freq: Every day | ORAL | 5 refills | Status: DC

## 2017-11-10 NOTE — Progress Notes (Signed)
Patient: Wesley Sharp Male    DOB: [birth year]   26 y.o.   MRN: [id number] Visit Date: 11/10/2017  Today's Provider: Margaretann Loveless, PA-C   Chief Complaint  Patient presents with  . Follow-up   Subjective:    HPI  Follow up for ADHD  The patient was last seen for this 6 weeks ago. Changes made at last visit include taper off Prozac and start Trintellix 10 mg daily.  He reports excellent compliance with treatment. He feels that condition is Improved. He is not having side effects.  ------------------------------------------------------------------------------------   Depression, Follow-up  He  was last seen for this 6 weeks ago. Changes made at last visit include start Trintellix 10 mg daily.   He reports excellent compliance with treatment. He is not having side effects.   He reports excellent tolerance of treatment. Current symptoms include: depressed mood and anxiety today due to not taking medication today. He feels he is Worse since last visit. Patient reports that insurance was not covering medication. ------------------------------------------------------------------------     Allergies  Allergen Reactions  . Percocet [Oxycodone-Acetaminophen] Other (See Comments)  . Sulfa Antibiotics Rash     Current Outpatient Medications:  .  methylphenidate 36 MG PO CR tablet, Take 1 tablet (36 mg total) by mouth daily. Please do NOT fill until 10/20/17, Disp: 30 tablet, Rfl: 0 .  vortioxetine HBr (TRINTELLIX) 10 MG TABS tablet, Take 1 tablet (10 mg total) by mouth daily., Disp: 90 tablet, Rfl: 1  Review of Systems  Constitutional: Negative.   Respiratory: Negative.   Cardiovascular: Negative.   Psychiatric/Behavioral: The patient is nervous/anxious.     Social History   Tobacco Use  . Smoking status: Current Some Day Smoker    Packs/day: 0.25    Years: 4.00    Pack years: 1.00    Types: Cigarettes    Start date: 09/03/2008  . Smokeless tobacco:  Never Used  . Tobacco comment: last time he smoke was Nov 2017  Substance Use Topics  . Alcohol use: No    Alcohol/week: 0.0 oz    Comment: Very Rarely   Objective:   BP 100/76 (BP Location: Left Arm, Patient Position: Sitting, Cuff Size: Normal)   Pulse 74   Temp 98 F (36.7 C) (Oral)   Resp 16   Wt 159 lb (72.1 kg)   SpO2 98%   BMI 21.56 kg/m  Vitals:   11/10/17 1608  BP: 100/76  Pulse: 74  Resp: 16  Temp: 98 F (36.7 C)  TempSrc: Oral  SpO2: 98%  Weight: 159 lb (72.1 kg)     Physical Exam  Constitutional: He appears well-developed and well-nourished. No distress.  HENT:  Head: Normocephalic and atraumatic.  Neck: Normal range of motion. Neck supple.  Cardiovascular: Normal rate, regular rhythm and normal heart sounds. Exam reveals no gallop and no friction rub.  No murmur heard. Pulmonary/Chest: Effort normal and breath sounds normal. No respiratory distress. He has no wheezes. He has no rales.  Musculoskeletal: He exhibits no edema.  Skin: He is not diaphoretic.  Vitals reviewed.      Assessment & Plan:     1. Moderate episode of recurrent major depressive disorder [facility]) Patient is tolerating well. Will try to get approved. He has tried and failed Zyprexa, Mirtazapine, fluoxetine, paroxetine, citalopram, depakote, and wellbutrin. Also was given a payment card to activate to hopefully lower cost. If unable to get covered this time will change if we  have to.  - vortioxetine HBr (TRINTELLIX) 10 MG TABS tablet; Take 1 tablet (10 mg total) by mouth daily.  Dispense: 30 tablet; Refill: 5  2. GAD (generalized anxiety disorder) See above medical treatment plan. - vortioxetine HBr (TRINTELLIX) 10 MG TABS tablet; Take 1 tablet (10 mg total) by mouth daily.  Dispense: 30 tablet; Refill: 5  3. Attention deficit hyperactivity disorder (ADHD), predominantly inattentive type Stable. Diagnosis pulled for medication refill. Continue current medical treatment plan. -  methylphenidate 36 MG PO CR tablet; Take 1 tablet (36 mg total) by mouth daily.  Dispense: 30 tablet; Refill: 0       Margaretann Loveless, PA-C  [facility]

## 2017-12-17 ENCOUNTER — Other Ambulatory Visit: Payer: Self-pay | Admitting: Physician Assistant

## 2017-12-17 DIAGNOSIS — F411 Generalized anxiety disorder: Secondary | ICD-10-CM

## 2017-12-17 DIAGNOSIS — F331 Major depressive disorder, recurrent, moderate: Secondary | ICD-10-CM

## 2018-01-28 ENCOUNTER — Telehealth: Payer: Self-pay | Admitting: Physician Assistant

## 2018-01-28 DIAGNOSIS — F900 Attention-deficit hyperactivity disorder, predominantly inattentive type: Secondary | ICD-10-CM

## 2018-01-28 MED ORDER — METHYLPHENIDATE HCL ER 36 MG PO TB24: 36.0000 mg | ORAL_TABLET | Freq: Every day | ORAL | 0 refills | Status: DC

## 2018-01-28 NOTE — Telephone Encounter (Signed)
Sent in #30 methylphenidate.

## 2018-01-28 NOTE — Telephone Encounter (Signed)
Mother calling requesting a refill on the following medication. Thanks CC  methylphenidate 36 MG PO CR tablet

## 2018-01-30 NOTE — Telephone Encounter (Signed)
Thank you :)

## 2018-05-19 ENCOUNTER — Other Ambulatory Visit: Payer: Self-pay | Admitting: Physician Assistant

## 2018-05-19 DIAGNOSIS — F900 Attention-deficit hyperactivity disorder, predominantly inattentive type: Secondary | ICD-10-CM

## 2018-05-19 MED ORDER — METHYLPHENIDATE HCL ER 36 MG PO TB24: 36.0000 mg | ORAL_TABLET | Freq: Every day | ORAL | 0 refills | Status: DC

## 2018-05-19 NOTE — Progress Notes (Signed)
Refilled concerta

## 2018-06-28 HISTORY — PX: OTHER SURGICAL HISTORY: SHX169

## 2023-02-11 ENCOUNTER — Ambulatory Visit (INDEPENDENT_AMBULATORY_CARE_PROVIDER_SITE_OTHER): Payer: 59

## 2023-02-11 ENCOUNTER — Ambulatory Visit (INDEPENDENT_AMBULATORY_CARE_PROVIDER_SITE_OTHER): Payer: 59 | Admitting: Podiatry

## 2023-02-11 DIAGNOSIS — M79671 Pain in right foot: Secondary | ICD-10-CM

## 2023-02-11 DIAGNOSIS — M7751 Other enthesopathy of right foot: Secondary | ICD-10-CM | POA: Diagnosis not present

## 2023-02-11 MED ORDER — BETAMETHASONE SOD PHOS & ACET 6 (3-3) MG/ML IJ SUSP
3.0000 mg | Freq: Once | INTRAMUSCULAR | Status: AC
Start: 2023-02-11 — End: 2023-02-11
  Administered 2023-02-11: 3 mg via INTRA_ARTICULAR

## 2023-02-11 NOTE — Progress Notes (Signed)
   Chief Complaint  Patient presents with   Foot Pain    right foot pain had surgery a few years ago    HPI: 32 y.o. male presenting today as a reestablish new patient for evaluation of pain and tenderness associated to the right forefoot ongoing for about 1 month now.  Idiopathic onset.  He says that he woke up in the morning with pain and tenderness to the right foot with some mild swelling and redness.  Denies any history of injury or change in activity.  Past Medical History:  Diagnosis Date   Anxiety     Past Surgical History:  Procedure Laterality Date   NO PAST SURGERIES      Allergies  Allergen Reactions   Percocet [Oxycodone-Acetaminophen] Other (See Comments)   Sulfa Antibiotics Rash     Physical Exam: General: The patient is alert and oriented x3 in no acute distress.  Dermatology: Skin is warm, dry and supple bilateral lower extremities.   Vascular: Palpable pedal pulses bilaterally. Capillary refill within normal limits.  There are some very mild localized edema noted around the distal portion of the right foot  Neurological: Grossly intact via light touch  Musculoskeletal Exam: No pedal deformities noted.  ROM WNL.  There is some tenderness with palpation or range of motion specifically to the third MTP of the right foot  Radiographic Exam RT foot 02/11/2023:  Normal osseous mineralization. Joint spaces preserved.  No fractures or osseous irregularities noted.  Impression: Negative  Assessment/Plan of Care: 1.  Third MTP capsulitis right  -Patient evaluated.  X-rays reviewed -Injection of 0.5 cc Celestone Soluspan injected in the third MTP right -Prescription for Medrol Dosepak -Prescription for meloxicam 15 mg daily after completion of the Dosepak -Postsurgical shoe dispensed.  WBAT x 3-4 weeks -Return to clinic 4 weeks       Felecia Shelling, DPM [facility]  Dr. Felecia Shelling, DPM    [address] [place], [place] [address]                Office [phone]  Fax [phone]

## 2023-03-11 ENCOUNTER — Ambulatory Visit: Payer: 59 | Admitting: Podiatry

## 2023-04-29 ENCOUNTER — Other Ambulatory Visit: Payer: Self-pay

## 2023-04-29 ENCOUNTER — Encounter: Payer: Self-pay | Admitting: Emergency Medicine

## 2023-04-29 ENCOUNTER — Emergency Department
Admission: EM | Admit: 2023-04-29 | Discharge: 2023-04-29 | Disposition: A | Discharge status: Home / Self Care | Payer: 59 | Attending: Emergency Medicine | Admitting: Emergency Medicine

## 2023-04-29 DIAGNOSIS — S0501XA Injury of conjunctiva and corneal abrasion without foreign body, right eye, initial encounter: Secondary | ICD-10-CM | POA: Insufficient documentation

## 2023-04-29 DIAGNOSIS — H209 Unspecified iridocyclitis: Secondary | ICD-10-CM | POA: Diagnosis not present

## 2023-04-29 DIAGNOSIS — W548XXA Other contact with dog, initial encounter: Secondary | ICD-10-CM | POA: Insufficient documentation

## 2023-04-29 DIAGNOSIS — S0591XA Unspecified injury of right eye and orbit, initial encounter: Secondary | ICD-10-CM | POA: Diagnosis not present

## 2023-04-29 DIAGNOSIS — S0990XA Unspecified injury of head, initial encounter: Secondary | ICD-10-CM | POA: Insufficient documentation

## 2023-04-29 DIAGNOSIS — H2000 Unspecified acute and subacute iridocyclitis: Secondary | ICD-10-CM | POA: Diagnosis not present

## 2023-04-29 MED ORDER — HYDROCODONE-ACETAMINOPHEN 5-325 MG PO TABS: 2.0000 | ORAL_TABLET | Freq: Four times a day (QID) | ORAL | 0 refills | Status: DC | PRN

## 2023-04-29 MED ORDER — ERYTHROMYCIN 5 MG/GM OP OINT
TOPICAL_OINTMENT | Freq: Once | OPHTHALMIC | Status: AC
  Administered 2023-04-29: 1 via OPHTHALMIC
  Filled 2023-04-29: qty 1

## 2023-04-29 MED ORDER — OXYCODONE-ACETAMINOPHEN 5-325 MG PO TABS: 2.0000 | ORAL_TABLET | Freq: Once | ORAL | Status: DC

## 2023-04-29 MED ORDER — FLUORESCEIN SODIUM 1 MG OP STRP
2.0000 | ORAL_STRIP | Freq: Once | OPHTHALMIC | Status: AC
  Administered 2023-04-29: 2 via OPHTHALMIC
  Filled 2023-04-29: qty 2

## 2023-04-29 MED ORDER — TETRACAINE HCL 0.5 % OP SOLN
1.0000 [drp] | Freq: Once | OPHTHALMIC | Status: AC
  Administered 2023-04-29: 1 [drp] via OPHTHALMIC
  Filled 2023-04-29: qty 4

## 2023-04-29 MED ORDER — HYDROMORPHONE HCL 2 MG PO TABS
2.0000 mg | ORAL_TABLET | ORAL | Status: AC
  Administered 2023-04-29: 2 mg via ORAL
  Filled 2023-04-29: qty 1

## 2023-04-29 NOTE — ED Provider Notes (Signed)
[facility] Provider Note    Event Date/Time   First MD Initiated Contact with Patient 04/29/23 [phone]     (approximate)   History   Eye Pain   HPI Wesley Sharp is a 32 y.o. male who presents with his mother after sustaining an injury to his right eye.  The patient reports that his large dog jumped up and hit the patient's right eye with the top of the dog's head.  The patient went down to the ground and thinks he may have briefly blacked out.  He has had severe pain in his right eye since that time.  He has a great difficulty opening it up, but when he does so he can see that it is very red.  His vision is decreased but some of that is due to his extreme difficulty keeping his eye open.  He he said he does not have a headache or shortness of breath, just severe pain in the right eye.  He does not wear contact lenses.  He had no signs or symptoms prior to the injury.     Physical Exam   Triage Vital Signs: ED Triage Vitals [04/29/23 0120]  Encounter Vitals Group     BP 120/78     Systolic BP Percentile      Diastolic BP Percentile      Pulse Rate 73     Resp (!) 21     Temp 98.1 F (36.7 C)     Temp Source Oral     SpO2 97 %     Weight 79.4 kg (175 lb)     Height 1.829 m (6')     Head Circumference      Peak Flow      Pain Score 10     Pain Loc      Pain Education      Exclude from Growth Chart     Most recent vital signs: Vitals:   04/29/23 0120 04/29/23 0443  BP: 120/78 107/72  Pulse: 73 76  Resp: (!) 21 20  Temp: 98.1 F (36.7 C) 98.1 F (36.7 C)  SpO2: 97% 99%    General: Awake, severe distress due to right eye pain.  Patient cannot hold still. Eyes:  Substantial conjunctival injection on the right eye, minimal injection of the left eye.  Pupils are constricted, minimally reactive bilaterally.  Exam is limited by the patient's ability to hold still and cooperate due to the pain.  After administration of tetracaine and fluorescein, I  visualized a large central corneal abrasion on the right eye.  No other evidence of injury including any suggestion of injury to the globe.  No hyphema and no hypopyon at this time. CV:  Good peripheral perfusion.  Resp:  Normal effort. Speaking easily and comfortably, no accessory muscle usage nor intercostal retractions.   Abd:  No distention.    ED Results / Procedures / Treatments   Labs (all labs ordered are listed, but only abnormal results are displayed) Labs Reviewed - No data to display    PROCEDURES:  Critical Care performed: No  Procedures    IMPRESSION / MDM / ASSESSMENT AND PLAN / ED COURSE  I reviewed the triage vital signs and the nursing notes.                              Differential diagnosis includes, but is not limited to, traumatic iritis/uveitis,  corneal abrasion, corneal laceration, globe rupture, skull fracture, intracranial hemorrhage.  Patient's presentation is most consistent with acute presentation with potential threat to life or bodily function.  Interventions/Medications given:  Medications  tetracaine (PONTOCAINE) 0.5 % ophthalmic solution 1 drop (1 drop Both Eyes Given 04/29/23 0415)  fluorescein ophthalmic strip 2 strip (2 strips Both Eyes Given 04/29/23 0415)  HYDROmorphone (DILAUDID) tablet 2 mg (2 mg Oral Given 04/29/23 0440)  erythromycin ophthalmic ointment (1 Application Right Eye Given 04/29/23 0440)    (Note:  hospital course my include additional interventions and/or labs/studies not listed above.)   Vital signs are normal other than some tachypnea likely due to the pain.  The physical exam and history strongly suggest traumatic iritis, but the patient's discomfort is such that it is extremely difficult to obtain a good exam.  I tried after administration of oral Dilaudid and topical tetracaine to obtain pressures using a Tono-Pen, but the patient is unable to hold still enough to tolerate.  However, I think it is unlikely that  intraocular pressure would be substantially elevated enough to change the management.  I consulted by phone with Dr. Druscilla Brownie with ophthalmology.  We discussed the case and he suggested that I not administer cycloplegics at this time but rather encouraged the patient to come first thing in the morning (only about 3 hours from now) to the [facility] to be seen as soon as possible.  I updated the patient and his mother, provided erythromycin ophthalmic ointment, Dilaudid 2 mg p.o., and a prescription for Norco.  I went over the instructions in detail about following up in a few hours and they understand and agree with the plan.         FINAL CLINICAL IMPRESSION(S) / ED DIAGNOSES   Final diagnoses:  Minor head injury, initial encounter  Traumatic iritis  Right corneal abrasion, initial encounter     Rx / DC Orders   ED Discharge Orders          Ordered    HYDROcodone-acetaminophen (NORCO/VICODIN) 5-325 MG tablet  Every 6 hours PRN        04/29/23 0501             Note:  This document was prepared using Dragon voice recognition software and may include unintentional dictation errors.   Loleta Rose, MD 04/29/23 [phone]

## 2023-04-29 NOTE — ED Triage Notes (Signed)
Pt presents ambulatory to triage via POV with complaints of R eye pain after getting hit in the eye by his dog. Pt states the dog head butt him in the eye and he has some significant stinging pain when trying to open his eye. Erythema to the R eye - no swelling nor vision loss. A&Ox4 at this time. Denies seeing floaters nor drainage.

## 2023-04-29 NOTE — ED Notes (Signed)
Patient and mother provided with discharge instructions including prescriptions x1 and importance of follow up appt first thing this morning. Patient stable and wheeled out to safe ride by mother.
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
## 2023-05-01 DIAGNOSIS — S0501XD Injury of conjunctiva and corneal abrasion without foreign body, right eye, subsequent encounter: Secondary | ICD-10-CM | POA: Diagnosis not present

## 2023-05-03 DIAGNOSIS — S0501XD Injury of conjunctiva and corneal abrasion without foreign body, right eye, subsequent encounter: Secondary | ICD-10-CM | POA: Diagnosis not present

## 2024-01-19 DIAGNOSIS — T63441A Toxic effect of venom of bees, accidental (unintentional), initial encounter: Secondary | ICD-10-CM | POA: Diagnosis not present

## 2024-01-19 DIAGNOSIS — L237 Allergic contact dermatitis due to plants, except food: Secondary | ICD-10-CM | POA: Diagnosis not present

## 2024-03-02 ENCOUNTER — Ambulatory Visit: Admitting: Podiatry

## 2024-03-20 ENCOUNTER — Ambulatory Visit: Admitting: Podiatry

## 2024-03-30 ENCOUNTER — Ambulatory Visit: Admitting: Podiatry

## 2024-04-03 ENCOUNTER — Ambulatory Visit: Admitting: Podiatry

## 2024-04-06 ENCOUNTER — Ambulatory Visit (INDEPENDENT_AMBULATORY_CARE_PROVIDER_SITE_OTHER)

## 2024-04-06 ENCOUNTER — Ambulatory Visit: Admitting: Podiatry

## 2024-04-06 ENCOUNTER — Encounter: Payer: Self-pay | Admitting: Podiatry

## 2024-04-06 DIAGNOSIS — M7751 Other enthesopathy of right foot: Secondary | ICD-10-CM

## 2024-04-06 DIAGNOSIS — M65971 Unspecified synovitis and tenosynovitis, right ankle and foot: Secondary | ICD-10-CM | POA: Diagnosis not present

## 2024-04-06 MED ORDER — METHYLPREDNISOLONE 4 MG PO TBPK: ORAL_TABLET | ORAL | 0 refills | Status: DC

## 2024-04-06 MED ORDER — BETAMETHASONE SOD PHOS & ACET 6 (3-3) MG/ML IJ SUSP
3.0000 mg | Freq: Once | INTRAMUSCULAR | Status: AC
  Administered 2024-04-06: 3 mg via INTRA_ARTICULAR

## 2024-04-06 MED ORDER — MELOXICAM 15 MG PO TABS: 15.0000 mg | ORAL_TABLET | Freq: Every day | ORAL | 1 refills | Status: DC

## 2024-04-06 NOTE — Progress Notes (Signed)
   Chief Complaint  Patient presents with   Foot Pain    Pt is here due to right foot pain,he states its the same pain as last year, states that it started back last month, has a sharp pain in his toes and in the joints, after a while the foot goes numb.    HPI: 33 y.o. male presenting today for new complaint of pain and tenderness associated to the right forefoot ongoing for few months now.  Last seen 02/11/2023 for the same issue with tenderness and pain around the forefoot of the right foot.  Idiopathic onset.  No history of injury.  He is very active on his feet  Past Medical History:  Diagnosis Date   Anxiety     Past Surgical History:  Procedure Laterality Date   NO PAST SURGERIES      Allergies  Allergen Reactions   Percocet [Oxycodone -Acetaminophen ] Other (See Comments)   Sulfa Antibiotics Rash     Physical Exam: General: The patient is alert and oriented x3 in no acute distress.  Dermatology: Skin is warm, dry and supple bilateral lower extremities.   Vascular: Palpable pedal pulses bilaterally. Capillary refill within normal limits.  There are some very mild localized edema noted around the distal portion of the right foot  Neurological: Grossly intact via light touch  Musculoskeletal Exam: No pedal deformities noted.  ROM WNL.  Tenderness with palpation around the 3rd and 4th intermetatarsal space of the right foot  Radiographic Exam RT foot 04/06/2024:  Normal osseous mineralization. Joint spaces preserved.  No fractures or osseous irregularities noted.  Impression: Negative  Assessment/Plan of Care: 1.  3rd and 4th intermetatarsal bursitis right foot  -Patient evaluated.  X-rays reviewed -With the patient was seen last year he had really good success with cortisone injection as well as oral anti-inflammatory medication.  This seemed to alleviate all of his symptoms and pain.  We will repeat the same regimen today -Injection of 0.5 cc Celestone  Soluspan injected  in the third MTP right -Prescription for Medrol Dosepak -Prescription for meloxicam 15 mg daily after completion of the Dosepak - OTC power step insoles were dispensed to wear in his work boots -Refrain from going barefoot.  Recommend good supportive shoes and sneakers -Return to clinic PRN  *Very active at work.  Forklift, on boxing, physical labor       Thresa EMERSON Sar, DPM [facility]  Dr. Thresa EMERSON Sar, DPM    [address] [place], [place] [address]                Office [phone]  Fax [phone]

## 2024-06-22 ENCOUNTER — Ambulatory Visit: Payer: Self-pay | Admitting: Family

## 2024-06-22 ENCOUNTER — Encounter: Payer: Self-pay | Admitting: Family

## 2024-06-22 VITALS — BP 100/70 | HR 71 | Temp 97.7°F | Ht 72.5 in | Wt 172.0 lb

## 2024-06-22 DIAGNOSIS — R42 Dizziness and giddiness: Secondary | ICD-10-CM | POA: Diagnosis not present

## 2024-06-22 DIAGNOSIS — F419 Anxiety disorder, unspecified: Secondary | ICD-10-CM

## 2024-06-22 DIAGNOSIS — F32A Depression, unspecified: Secondary | ICD-10-CM

## 2024-06-22 DIAGNOSIS — Z202 Contact with and (suspected) exposure to infections with a predominantly sexual mode of transmission: Secondary | ICD-10-CM

## 2024-06-22 DIAGNOSIS — Z0000 Encounter for general adult medical examination without abnormal findings: Secondary | ICD-10-CM

## 2024-06-22 DIAGNOSIS — R5383 Other fatigue: Secondary | ICD-10-CM | POA: Diagnosis not present

## 2024-06-22 DIAGNOSIS — Z13220 Encounter for screening for lipoid disorders: Secondary | ICD-10-CM

## 2024-06-22 DIAGNOSIS — I499 Cardiac arrhythmia, unspecified: Secondary | ICD-10-CM | POA: Diagnosis not present

## 2024-06-22 LAB — BASIC METABOLIC PANEL WITH GFR
BUN: 11 mg/dL (ref 6–23)
CO2: 29 meq/L (ref 19–32)
Calcium: 9.4 mg/dL (ref 8.4–10.5)
Chloride: 102 meq/L (ref 96–112)
Creatinine, Ser: 0.86 mg/dL (ref 0.40–1.50)
GFR: 113.78 mL/min
Glucose, Bld: 89 mg/dL (ref 70–99)
Potassium: 4.7 meq/L (ref 3.5–5.1)
Sodium: 139 meq/L (ref 135–145)

## 2024-06-22 LAB — CBC
HCT: 42 % (ref 39.0–52.0)
Hemoglobin: 14 g/dL (ref 13.0–17.0)
MCHC: 33.2 g/dL (ref 30.0–36.0)
MCV: 90.7 fl (ref 78.0–100.0)
Platelets: 224 K/uL (ref 150.0–400.0)
RBC: 4.63 Mil/uL (ref 4.22–5.81)
RDW: 12.8 % (ref 11.5–15.5)
WBC: 5.5 K/uL (ref 4.0–10.5)

## 2024-06-22 LAB — VITAMIN B12: Vitamin B-12: 227 pg/mL (ref 211–911)

## 2024-06-22 LAB — LIPID PANEL
Cholesterol: 154 mg/dL (ref 28–200)
HDL: 45.9 mg/dL
LDL Cholesterol: 83 mg/dL (ref 10–99)
NonHDL: 107.61
Total CHOL/HDL Ratio: 3
Triglycerides: 121 mg/dL (ref 10.0–149.0)
VLDL: 24.2 mg/dL (ref 0.0–40.0)

## 2024-06-22 LAB — TSH: TSH: 1.35 u[IU]/mL (ref 0.35–5.50)

## 2024-06-22 NOTE — Progress Notes (Signed)
 "  Subjective:  Patient ID: Wesley Sharp, male    DOB: December 16, 1990  Age: 33 y.o. MRN: [id number]  No care team member to display   CC:  Chief Complaint  Patient presents with   Establish Care    Previous PCP retired. Has not had a PCP in at least 7 years.      HPI Wesley Sharp is a 33 y.o. male who presents today for an annual physical exam. He reports consuming a general diet. The patient does not participate in regular exercise at present. He generally feels well. He reports sleeping fairly well. He does not have additional problems to discuss today.   Vision:Within last year Dental:Receives regular dental care STD:The patient denies history of sexually transmitted disease.  Pt is without acute concerns.   Discussed the use of AI scribe software for clinical note transcription with the patient, who gave verbal consent to proceed.  History of Present Illness Wesley Sharp is a 33 year old male who presents with foot pain and stiffness.  He experiences stiffness and irritation in the joint of the toe beside the middle toe on the right foot; this is not the same area where he previously had a bone spur removed. The stiffness is most pronounced in the mornings and is localized to that specific area. He is unsure of any past injury to the toe. Cortisone injections and a Medrol  DosePak initially provided relief, but the effects were not long-lasting. He spends about ten hours a day on his feet at work, which may contribute to the discomfort.  He experiences occasional dizziness, which he associates with low blood pressure or glucose levels. He manages this by eating protein or oat bars every two to three hours, which helps prevent dizziness if consumed before symptoms start. Dizziness episodes can last for about a week, occurring on and off, and are sometimes associated with allergy symptoms.  He has a history of allergies that manifest as nasal drainage and other symptoms, which he manages  as they occur. He has no known drug allergies except for a rash from sulfa medications.  He reports sleep disturbances, particularly during stressful periods, and prefers not to use medications like melatonin for sleep. He occasionally wakes up in the middle of the night and struggles to return to sleep, especially during busy work seasons.  He has a history of ADHD and has previously been on medications such as Ritalin  and Trintellix , but discontinued them due to adverse effects like stomach aches and not feeling like himself. He is not currently taking any medications.  He does not consume alcohol regularly but uses marijuana daily. He has quit smoking cigarettes and does not vape, except for marijuana. He lives with his mother and brother in a large house, which provides some personal space despite living together. 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   Advanced Directives Patient does not have advanced directives   DEPRESSION SCREENING    06/22/2024    9:32 AM 06/22/2024    9:28 AM 11/10/2017    4:06 PM  PHQ 2/9 Scores  PHQ - 2 Score 0 0 0  PHQ- 9 Score   4      Data saved with a previous flowsheet row definition     ROS: Negative unless specifically indicated above in HPI.   Current Medications[1]    Objective:    BP 100/70 (BP Location: Left Arm, Patient  Position: Sitting, Cuff Size: Normal)   Pulse 71   Temp 97.7 F (36.5 C) (Oral)   Ht 6' 0.5 (1.842 m)   Wt 172 lb (78 kg)   SpO2 97%   BMI 23.01 kg/m   BP Readings from Last 3 Encounters:  06/22/24 100/70  04/29/23 107/72  11/10/17 100/76      Physical Exam Vitals reviewed.  Constitutional:      General: He is not in acute distress.    Appearance: Normal appearance. He is normal weight. He is not ill-appearing, toxic-appearing or diaphoretic.  HENT:     Head: Normocephalic.     Right Ear: Hearing, tympanic membrane, ear canal and external ear normal.      Left Ear: Hearing, tympanic membrane, ear canal and external ear normal.     Nose: Nose normal.     Mouth/Throat:     Mouth: Mucous membranes are moist.     Pharynx: Postnasal drip present.  Eyes:     Pupils: Pupils are equal, round, and reactive to light.  Cardiovascular:     Rate and Rhythm: Normal rate and regular rhythm. Occasional Extrasystoles are present.    Heart sounds: Normal heart sounds.  Pulmonary:     Effort: Pulmonary effort is normal.     Breath sounds: Normal breath sounds.  Abdominal:     General: Abdomen is flat.     Tenderness: There is no abdominal tenderness.  Musculoskeletal:        General: Normal range of motion.     Right lower leg: No edema.     Left lower leg: No edema.  Skin:    General: Skin is warm.  Neurological:     General: No focal deficit present.     Mental Status: He is alert and oriented to person, place, and time. Mental status is at baseline.  Psychiatric:        Mood and Affect: Mood normal.        Behavior: Behavior normal.        Thought Content: Thought content normal.        Judgment: Judgment normal.       Results Radiology Right foot X-[place] (04/06/2024): Findings consistent with third and fourth metatarsal bursitis; bone spur present at a separate site (Independently interpreted)      Assessment & Plan:   Assessment and Plan Assessment & Plan Right foot enthesopathy Chronic right foot enthesopathy with stiffness and irritation in the joint between the middle and pinky toes. Previous bone spur removal and steroid injections provided temporary relief. Current symptoms exacerbated by prolonged standing at work. He prefers to avoid surgery due to previous outcomes. - Continue follow-up with foot doctor for ongoing management.  Dizziness Intermittent dizziness with visual disturbances, possibly related to allergies or blood sugar fluctuations. Symptoms occur in waves, lasting about a week, and are associated with allergy  symptoms. No recent episodes in the past three weeks. Possible contribution from caffeine intake and dehydration. - Ordered labs to check blood sugar and other relevant parameters. - Advised on maintaining regular meals and hydration to prevent blood sugar dips. - Recommended Zyrtec  for allergy symptoms if they correlate with dizziness. - Performed EKG to assess for PVCs or other cardiac issues. - Advised reducing caffeine intake and ensuring adequate hydration.  General Health Maintenance Discussion on HPV vaccination and its benefits in preventing anal cancer in men. He is considering the vaccine but has not decided yet. Importance of regular dental and eye exams  discussed. - Consider HPV vaccination series if not previously vaccinated. - Ensure regular dental and eye exams. -Patient Counseling(The following topics were reviewed):  Preventative care handout given to pt  Health maintenance and immunizations reviewed. Please refer to Health maintenance section. Pt advised on safe sex, wearing seatbelts in car, and proper nutrition labwork ordered today for annual Dental health: Discussed importance of regular tooth brushing, flossing, and dental visits.         Follow-up: Return in about 1 year (around 06/22/2025) for f/u CPE.   Ginger Patrick, FNP      [1] No current outpatient medications on file.  "

## 2024-06-23 LAB — HSV 1 AND 2 AB, IGG
HSV 1 Glycoprotein G Ab, IgG: NONREACTIVE
HSV 2 IgG, Type Spec: NONREACTIVE

## 2024-06-23 LAB — SYPHILIS: RPR W/REFLEX TO RPR TITER AND TREPONEMAL ANTIBODIES, TRADITIONAL SCREENING AND DIAGNOSIS ALGORITHM: RPR Ser Ql: NONREACTIVE

## 2024-06-23 LAB — HIV ANTIBODY (ROUTINE TESTING W REFLEX)
HIV 1&2 Ab, 4th Generation: NONREACTIVE
HIV FINAL INTERPRETATION: NEGATIVE

## 2024-06-23 LAB — HEPATITIS C ANTIBODY: Hepatitis C Ab: NONREACTIVE

## 2024-06-24 ENCOUNTER — Ambulatory Visit: Payer: Self-pay | Admitting: Family

## 2024-06-24 LAB — CHLAMYDIA/GONOCOCCUS/TRICHOMONAS, NAA
Chlamydia by NAA: NEGATIVE
Gonococcus by NAA: NEGATIVE
Trich vag by NAA: NEGATIVE

## 2024-07-09 ENCOUNTER — Encounter: Payer: Self-pay | Admitting: Family

## 2024-07-09 DIAGNOSIS — R002 Palpitations: Secondary | ICD-10-CM

## 2024-07-17 ENCOUNTER — Ambulatory Visit: Attending: Family

## 2024-07-17 DIAGNOSIS — R002 Palpitations: Secondary | ICD-10-CM
